# Patient Record
Sex: Male | Born: 1960 | Race: Black or African American | Hispanic: No | Marital: Single | Smoking: Former smoker
Health system: Southern US, Community
[De-identification: ages and names within clinical notes are randomized; demographics above are authoritative.]

## PROBLEM LIST (undated history)

## (undated) DIAGNOSIS — G43909 Migraine, unspecified, not intractable, without status migrainosus: Secondary | ICD-10-CM

## (undated) DIAGNOSIS — S12400A Unspecified displaced fracture of fifth cervical vertebra, initial encounter for closed fracture: Secondary | ICD-10-CM

## (undated) DIAGNOSIS — E6609 Other obesity due to excess calories: Secondary | ICD-10-CM

## (undated) DIAGNOSIS — C61 Malignant neoplasm of prostate: Secondary | ICD-10-CM

## (undated) DIAGNOSIS — S32009A Unspecified fracture of unspecified lumbar vertebra, initial encounter for closed fracture: Secondary | ICD-10-CM

## (undated) DIAGNOSIS — S060X9A Concussion with loss of consciousness of unspecified duration, initial encounter: Secondary | ICD-10-CM

## (undated) DIAGNOSIS — S322XXA Fracture of coccyx, initial encounter for closed fracture: Secondary | ICD-10-CM

## (undated) DIAGNOSIS — M1990 Unspecified osteoarthritis, unspecified site: Secondary | ICD-10-CM

## (undated) DIAGNOSIS — E781 Pure hyperglyceridemia: Secondary | ICD-10-CM

## (undated) DIAGNOSIS — I10 Essential (primary) hypertension: Secondary | ICD-10-CM

## (undated) DIAGNOSIS — S12100A Unspecified displaced fracture of second cervical vertebra, initial encounter for closed fracture: Secondary | ICD-10-CM

## (undated) DIAGNOSIS — Z87891 Personal history of nicotine dependence: Secondary | ICD-10-CM

## (undated) DIAGNOSIS — S3210XA Unspecified fracture of sacrum, initial encounter for closed fracture: Secondary | ICD-10-CM

## (undated) DIAGNOSIS — Z6832 Body mass index (BMI) 32.0-32.9, adult: Secondary | ICD-10-CM

## (undated) HISTORY — DX: Personal history of nicotine dependence: Z87.891

## (undated) HISTORY — DX: Unspecified fracture of unspecified lumbar vertebra, initial encounter for closed fracture: S32.009A

## (undated) HISTORY — DX: Body mass index (bmi) 32.0-32.9, adult: Z68.32

## (undated) HISTORY — DX: Unspecified displaced fracture of fifth cervical vertebra, initial encounter for closed fracture: S12.400A

## (undated) HISTORY — DX: Malignant neoplasm of prostate: C61

## (undated) HISTORY — DX: Unspecified fracture of sacrum, initial encounter for closed fracture: S32.10XA

## (undated) HISTORY — DX: Concussion with loss of consciousness of unspecified duration, initial encounter: S06.0X9A

## (undated) HISTORY — DX: Fracture of coccyx, initial encounter for closed fracture: S32.2XXA

## (undated) HISTORY — PX: OTHER SURGICAL HISTORY: SHX169

## (undated) HISTORY — DX: Other obesity due to excess calories: E66.09

## (undated) HISTORY — DX: Pure hyperglyceridemia: E78.1

## (undated) HISTORY — PX: ACHILLES TENDON REPAIR: SUR1153

## (undated) HISTORY — DX: Unspecified displaced fracture of second cervical vertebra, initial encounter for closed fracture: S12.100A

---

## 2013-12-29 ENCOUNTER — Observation Stay (HOSPITAL_COMMUNITY)
Admission: EM | Admit: 2013-12-29 | Discharge: 2013-12-31 | Disposition: A | Discharge status: Home / Self Care | Payer: No Typology Code available for payment source | Attending: General Surgery | Admitting: General Surgery

## 2013-12-29 ENCOUNTER — Emergency Department (HOSPITAL_COMMUNITY): Payer: No Typology Code available for payment source

## 2013-12-29 ENCOUNTER — Encounter (HOSPITAL_COMMUNITY): Payer: Self-pay | Admitting: Emergency Medicine

## 2013-12-29 DIAGNOSIS — S12400A Unspecified displaced fracture of fifth cervical vertebra, initial encounter for closed fracture: Secondary | ICD-10-CM | POA: Diagnosis not present

## 2013-12-29 DIAGNOSIS — S12100A Unspecified displaced fracture of second cervical vertebra, initial encounter for closed fracture: Secondary | ICD-10-CM | POA: Diagnosis not present

## 2013-12-29 DIAGNOSIS — S3210XA Unspecified fracture of sacrum, initial encounter for closed fracture: Secondary | ICD-10-CM | POA: Diagnosis not present

## 2013-12-29 DIAGNOSIS — T07XXXA Unspecified multiple injuries, initial encounter: Secondary | ICD-10-CM | POA: Diagnosis present

## 2013-12-29 DIAGNOSIS — S060X9A Concussion with loss of consciousness of unspecified duration, initial encounter: Secondary | ICD-10-CM

## 2013-12-29 DIAGNOSIS — S060XAA Concussion with loss of consciousness status unknown, initial encounter: Secondary | ICD-10-CM | POA: Diagnosis present

## 2013-12-29 DIAGNOSIS — S322XXA Fracture of coccyx, initial encounter for closed fracture: Secondary | ICD-10-CM

## 2013-12-29 DIAGNOSIS — F17200 Nicotine dependence, unspecified, uncomplicated: Secondary | ICD-10-CM | POA: Insufficient documentation

## 2013-12-29 DIAGNOSIS — G43909 Migraine, unspecified, not intractable, without status migrainosus: Secondary | ICD-10-CM | POA: Diagnosis not present

## 2013-12-29 DIAGNOSIS — IMO0002 Reserved for concepts with insufficient information to code with codable children: Secondary | ICD-10-CM | POA: Insufficient documentation

## 2013-12-29 DIAGNOSIS — S32009A Unspecified fracture of unspecified lumbar vertebra, initial encounter for closed fracture: Secondary | ICD-10-CM | POA: Diagnosis not present

## 2013-12-29 HISTORY — DX: Unspecified fracture of unspecified lumbar vertebra, initial encounter for closed fracture: S32.009A

## 2013-12-29 HISTORY — DX: Unspecified fracture of sacrum, initial encounter for closed fracture: S32.10XA

## 2013-12-29 HISTORY — DX: Essential (primary) hypertension: I10

## 2013-12-29 HISTORY — DX: Fracture of coccyx, initial encounter for closed fracture: S32.2XXA

## 2013-12-29 HISTORY — DX: Migraine, unspecified, not intractable, without status migrainosus: G43.909

## 2013-12-29 HISTORY — DX: Concussion with loss of consciousness of unspecified duration, initial encounter: S06.0X9A

## 2013-12-29 HISTORY — DX: Concussion with loss of consciousness status unknown, initial encounter: S06.0XAA

## 2013-12-29 HISTORY — DX: Unspecified displaced fracture of second cervical vertebra, initial encounter for closed fracture: S12.100A

## 2013-12-29 HISTORY — DX: Unspecified displaced fracture of fifth cervical vertebra, initial encounter for closed fracture: S12.400A

## 2013-12-29 LAB — I-STAT CHEM 8, ED
BUN: 15 mg/dL (ref 6–23)
CALCIUM ION: 1.02 mmol/L — AB (ref 1.12–1.23)
CHLORIDE: 113 meq/L — AB (ref 96–112)
Creatinine, Ser: 1.4 mg/dL — ABNORMAL HIGH (ref 0.50–1.35)
GLUCOSE: 108 mg/dL — AB (ref 70–99)
HCT: 45 % (ref 39.0–52.0)
Hemoglobin: 15.3 g/dL (ref 13.0–17.0)
POTASSIUM: 3.3 meq/L — AB (ref 3.7–5.3)
Sodium: 139 mEq/L (ref 137–147)
TCO2: 24 mmol/L (ref 0–100)

## 2013-12-29 LAB — URINALYSIS, ROUTINE W REFLEX MICROSCOPIC
BILIRUBIN URINE: NEGATIVE
GLUCOSE, UA: NEGATIVE mg/dL
Ketones, ur: NEGATIVE mg/dL
LEUKOCYTES UA: NEGATIVE
Nitrite: NEGATIVE
PH: 5 (ref 5.0–8.0)
Protein, ur: 30 mg/dL — AB
Specific Gravity, Urine: >1.046 — ABNORMAL HIGH (ref 1.005–1.030)
Urobilinogen, UA: 0.2 mg/dL (ref 0.0–1.0)

## 2013-12-29 LAB — URINE MICROSCOPIC-ADD ON

## 2013-12-29 MED ORDER — MORPHINE SULFATE 4 MG/ML IJ SOLN
6.0000 mg | Freq: Once | INTRAMUSCULAR | Status: DC
  Filled 2013-12-29: qty 2

## 2013-12-29 MED ORDER — MORPHINE SULFATE 2 MG/ML IJ SOLN
2.0000 mg | INTRAMUSCULAR | Status: DC | PRN
  Administered 2013-12-29: 2 mg via INTRAVENOUS
  Filled 2013-12-29 (×2): qty 1

## 2013-12-29 MED ORDER — FENTANYL CITRATE 0.05 MG/ML IJ SOLN
100.0000 ug | Freq: Once | INTRAMUSCULAR | Status: AC
  Administered 2013-12-29: 100 ug via INTRAVENOUS
  Filled 2013-12-29: qty 2

## 2013-12-29 MED ORDER — PANTOPRAZOLE SODIUM 40 MG PO TBEC
40.0000 mg | DELAYED_RELEASE_TABLET | Freq: Every day | ORAL | Status: DC
  Administered 2013-12-29: 40 mg via ORAL
  Filled 2013-12-29: qty 1

## 2013-12-29 MED ORDER — ONDANSETRON HCL 4 MG/2ML IJ SOLN: 4.0000 mg | Freq: Four times a day (QID) | INTRAMUSCULAR | Status: DC | PRN

## 2013-12-29 MED ORDER — SODIUM CHLORIDE 0.9 % IV SOLN
Freq: Once | INTRAVENOUS | Status: AC
  Administered 2013-12-29: 07:00:00 via INTRAVENOUS

## 2013-12-29 MED ORDER — ONDANSETRON HCL 4 MG PO TABS: 4.0000 mg | ORAL_TABLET | Freq: Four times a day (QID) | ORAL | Status: DC | PRN

## 2013-12-29 MED ORDER — TETANUS-DIPHTH-ACELL PERTUSSIS 5-2.5-18.5 LF-MCG/0.5 IM SUSP
0.5000 mL | Freq: Once | INTRAMUSCULAR | Status: AC
  Administered 2013-12-29: 0.5 mL via INTRAMUSCULAR

## 2013-12-29 MED ORDER — HYDROCODONE-ACETAMINOPHEN 10-325 MG PO TABS
0.5000 | ORAL_TABLET | ORAL | Status: DC | PRN
  Administered 2013-12-29 (×2): 2 via ORAL
  Administered 2013-12-30 – 2013-12-31 (×4): 1 via ORAL
  Filled 2013-12-29: qty 1
  Filled 2013-12-29: qty 2
  Filled 2013-12-29 (×3): qty 1
  Filled 2013-12-29: qty 2

## 2013-12-29 MED ORDER — ENOXAPARIN SODIUM 30 MG/0.3ML ~~LOC~~ SOLN
30.0000 mg | Freq: Two times a day (BID) | SUBCUTANEOUS | Status: DC
  Administered 2013-12-29 – 2013-12-31 (×5): 30 mg via SUBCUTANEOUS
  Filled 2013-12-29 (×6): qty 0.3

## 2013-12-29 MED ORDER — IOHEXOL 300 MG/ML  SOLN
100.0000 mL | Freq: Once | INTRAMUSCULAR | Status: AC | PRN
  Administered 2013-12-29: 100 mL via INTRAVENOUS

## 2013-12-29 MED ORDER — TETANUS-DIPHTH-ACELL PERTUSSIS 5-2.5-18.5 LF-MCG/0.5 IM SUSP
INTRAMUSCULAR | Status: AC
  Filled 2013-12-29: qty 0.5

## 2013-12-29 MED ORDER — MORPHINE SULFATE 4 MG/ML IJ SOLN
6.0000 mg | Freq: Once | INTRAMUSCULAR | Status: AC
  Administered 2013-12-29: 6 mg via INTRAVENOUS
  Filled 2013-12-29: qty 2

## 2013-12-29 MED ORDER — PANTOPRAZOLE SODIUM 40 MG IV SOLR
40.0000 mg | Freq: Every day | INTRAVENOUS | Status: DC
  Filled 2013-12-29 (×2): qty 40

## 2013-12-29 MED ORDER — POLYETHYLENE GLYCOL 3350 17 G PO PACK
17.0000 g | PACK | Freq: Every day | ORAL | Status: DC
  Administered 2013-12-30 – 2013-12-31 (×2): 17 g via ORAL
  Filled 2013-12-29 (×3): qty 1

## 2013-12-29 MED ORDER — DOCUSATE SODIUM 100 MG PO CAPS
100.0000 mg | ORAL_CAPSULE | Freq: Two times a day (BID) | ORAL | Status: DC
  Administered 2013-12-29 – 2013-12-31 (×4): 100 mg via ORAL
  Filled 2013-12-29 (×5): qty 1

## 2013-12-29 MED ORDER — POTASSIUM CHLORIDE IN NACL 20-0.45 MEQ/L-% IV SOLN
INTRAVENOUS | Status: DC
  Administered 2013-12-29: 15:00:00 via INTRAVENOUS
  Filled 2013-12-29 (×2): qty 1000

## 2013-12-29 NOTE — Progress Notes (Signed)
SLP Cancellation Note  Patient Details Name: Aaron Frey MRN: [id number] DOB: 1960/05/24   Cancelled treatment:        Patient reports his speech is more slurred than usual, but was unable to participate in Speech/cognitive evaluation due to pain.  RN present, and just gave morphine.  Pt. Continued to c/o head and neck pain and dizziness, and would only lie on his left side/stomach.  "I can't do this today.  I'm in pain."  Deferred eval.   [name] T 12/29/2013, 3:46 PM
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
## 2013-12-29 NOTE — ED Notes (Signed)
Pt is unable to urinate at this time. 

## 2013-12-29 NOTE — Consult Note (Signed)
I have seen and examined Aaron Frey and agree with the note.  His coccyx fracture is minimal and no treatment is needed.  He can weight bear as tolerated.
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
## 2013-12-29 NOTE — ED Notes (Signed)
This RN has cleansed and wrapped the pt's various wounds.

## 2013-12-29 NOTE — ED Notes (Signed)
Pt unable to ambulate with steady gait. Pt sways on his feet. Upon standing, pt became pale and diaphoretic.

## 2013-12-29 NOTE — ED Notes (Signed)
This RN and pt returned from CT 

## 2013-12-29 NOTE — H&P (Signed)
Somnolent but appropriate, possible concussion, agree with above consults.

## 2013-12-29 NOTE — ED Notes (Signed)
Pt unable to walk at this time due to pain. Jacubowitz, MD is aware and orders are to follow.

## 2013-12-29 NOTE — ED Notes (Signed)
This RN accompanying pt to CT. 
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
## 2013-12-30 ENCOUNTER — Encounter (HOSPITAL_COMMUNITY): Payer: Self-pay | Admitting: Orthopedic Surgery

## 2013-12-30 DIAGNOSIS — I10 Essential (primary) hypertension: Secondary | ICD-10-CM | POA: Insufficient documentation

## 2013-12-30 DIAGNOSIS — G43909 Migraine, unspecified, not intractable, without status migrainosus: Secondary | ICD-10-CM | POA: Insufficient documentation

## 2013-12-30 MED ORDER — SUMATRIPTAN SUCCINATE 6 MG/0.5ML ~~LOC~~ SOLN
6.0000 mg | Freq: Once | SUBCUTANEOUS | Status: AC
  Administered 2013-12-30: 6 mg via SUBCUTANEOUS
  Filled 2013-12-30: qty 0.5
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
## 2013-12-30 NOTE — Progress Notes (Signed)
Patient ID: Aaron Frey, male   DOB: November 30, 1960, 53 y.o.   MRN: [id number]   LOS: 1 day   Subjective: C/o HA, pain everywhere when he tries to move. Has hx/o migraines.   Objective: Vital signs in last 24 hours: Temp:  [97.1 F (36.2 C)-99.5 F (37.5 C)] 98.2 F (36.8 C) (09/10 0549) Pulse Rate:  [64-85] 77 (09/10 0549) Resp:  [12-23] 23 (09/09 1130) BP: (118-167)/(62-112) 145/112 mmHg (09/10 0549) SpO2:  [96 %-100 %] 100 % (09/10 0549) Last BM Date: 12/28/13   Physical Exam General appearance: no distress Resp: clear to auscultation bilaterally Cardio: regular rate and rhythm GI: normal findings: bowel sounds normal and soft, non-tender Neuro: Appropriate, PERRL   Assessment/Plan: [facility] Concussion -- Will give Imitrex for likely post-concussive migraine C-spine -- Uncertain if actual injury, to f/u as OP with NS L-spine TVP fx Sacrococcygeal fx -- WBAT FEN -- No issues, SL IV VTE -- SCD's, Lovenox Dispo -- TBI team    Freeman Caldron, PA-C Pager: [phone] General Trauma PA Pager: [phone]  12/30/2013
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
## 2013-12-30 NOTE — Progress Notes (Signed)
Seen, agree with above.  PT evaluation for mobility. Home when adequate mobility and pain control

## 2013-12-30 NOTE — Progress Notes (Signed)
Utilization review completed.  

## 2013-12-31 MED ORDER — HYDROCODONE-ACETAMINOPHEN 5-325 MG PO TABS: 1.0000 | ORAL_TABLET | ORAL | Status: DC | PRN

## 2013-12-31 NOTE — Progress Notes (Signed)
Occupational Therapy Evaluation Addendum:   01-24-14 1500  OT G-codes **NOT FOR INPATIENT CLASS**  Functional Limitation Self care  Self Care Current Status (Q0347) CK  Self Care Goal Status (Q2595) CI  Jeani Hawking, OTR/L [phone]
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
## 2013-12-31 NOTE — Discharge Instructions (Signed)
Wash wounds daily in shower with soap and water. °Do not soak. °Apply antibiotic ointment (e.g. Neosporin) twice daily and as needed to keep moist. °Cover with dry dressing. ° °No driving while taking hydrocodone. °

## 2013-12-31 NOTE — Progress Notes (Signed)
Seen and agree  
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
## 2013-12-31 NOTE — Progress Notes (Signed)
Patient ID: Aaron Frey, male   DOB: 06/19/1960, 53 y.o.   MRN: [id number]   LOS: 2 days   Subjective: No new c/o.   Objective: Vital signs in last 24 hours: Temp:  [98 F (36.7 C)-98.4 F (36.9 C)] 98 F (36.7 C) (09/11 0621) Pulse Rate:  [71-80] 80 (09/11 0621) Resp:  [16] 16 (09/11 0621) BP: (147-154)/(75-85) 154/85 mmHg (09/11 0621) SpO2:  [97 %-99 %] 97 % (09/11 0621) Last BM Date: 12/28/13   Physical Exam General appearance: alert and no distress Resp: clear to auscultation bilaterally Cardio: regular rate and rhythm GI: normal findings: bowel sounds normal and soft, non-tender   Assessment/Plan: M[facility]  Concussion -- Stable C-spine -- Uncertain if actual injury, to f/u as OP with NS  L-spine TVP fx  Sacrococcygeal fx -- WBAT  Dispo -- D/C home    Freeman Caldron, PA-C Pager: [phone] General Trauma PA Pager: [phone]  12/31/2013

## 2013-12-31 NOTE — Progress Notes (Signed)
Utilization review completed.  
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
## 2013-12-31 NOTE — Progress Notes (Addendum)
Rolling walker called into [facility] for delivery.   ADDENDUM** 1621pm  After PT treatment, HHPT was recommended. Pt agreeable to this.  Stated that he does have insurance through his job at [employer] on [address] in [place].  He attempted to call his supervisor since he didn't have his card with him but was unable to reach that person at work.  Pt did confirm that his address and phone number listed in EPIC are correct. Referral called to [facility] rep, [place].  Carlyle Lipa, RN BSN MHA CCM  Case Manager, Trauma Service/Unit 79M [phone]

## 2016-02-27 ENCOUNTER — Emergency Department (INDEPENDENT_AMBULATORY_CARE_PROVIDER_SITE_OTHER): Payer: 59

## 2016-02-27 ENCOUNTER — Encounter: Payer: Self-pay | Admitting: *Deleted

## 2016-02-27 ENCOUNTER — Emergency Department (INDEPENDENT_AMBULATORY_CARE_PROVIDER_SITE_OTHER)
Admission: EM | Admit: 2016-02-27 | Discharge: 2016-02-27 | Disposition: A | Discharge status: Home / Self Care | Payer: 59 | Source: Home / Self Care | Attending: Family Medicine | Admitting: Family Medicine

## 2016-02-27 DIAGNOSIS — M25571 Pain in right ankle and joints of right foot: Secondary | ICD-10-CM | POA: Diagnosis not present

## 2016-02-27 DIAGNOSIS — M79671 Pain in right foot: Secondary | ICD-10-CM | POA: Diagnosis not present

## 2016-02-27 DIAGNOSIS — S86011A Strain of right Achilles tendon, initial encounter: Secondary | ICD-10-CM | POA: Diagnosis not present

## 2016-02-27 MED ORDER — HYDROCODONE-ACETAMINOPHEN 5-325 MG PO TABS: ORAL_TABLET | ORAL | 0 refills | Status: DC

## 2016-02-27 NOTE — ED Triage Notes (Signed)
Pt c/o RT foot and ankle pain x 2 days after injury while playing basketball.

## 2016-02-27 NOTE — Discharge Instructions (Signed)
Wear cam walker.  Elevate foot as much as possible.  Apply ice pack for 20 to 30 minutes, 3 to 4 times daily  Continue until pain decreases.  May take Ibuprofen 200mg , 4 tabs every 8 hours with food.
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
## 2016-02-28 ENCOUNTER — Telehealth: Payer: Self-pay | Admitting: *Deleted

## 2016-02-28 NOTE — Telephone Encounter (Signed)
Pt called given appt information. Dr Pill today at 1:30pm arrive at 1:10pm. Clemens Catholichristy Marquay Kruse, LPN

## 2016-02-29 DIAGNOSIS — Z9889 Other specified postprocedural states: Secondary | ICD-10-CM | POA: Insufficient documentation

## 2016-08-30 ENCOUNTER — Encounter: Payer: Self-pay | Admitting: Physician Assistant

## 2016-08-30 ENCOUNTER — Ambulatory Visit (INDEPENDENT_AMBULATORY_CARE_PROVIDER_SITE_OTHER): Payer: 59 | Admitting: Physician Assistant

## 2016-08-30 ENCOUNTER — Ambulatory Visit (INDEPENDENT_AMBULATORY_CARE_PROVIDER_SITE_OTHER): Payer: 59 | Admitting: Sports Medicine

## 2016-08-30 VITALS — BP 145/76 | HR 102 | Wt 237.0 lb

## 2016-08-30 DIAGNOSIS — Z9889 Other specified postprocedural states: Secondary | ICD-10-CM

## 2016-08-30 DIAGNOSIS — Z7689 Persons encountering health services in other specified circumstances: Secondary | ICD-10-CM | POA: Diagnosis not present

## 2016-08-30 DIAGNOSIS — Z008 Encounter for other general examination: Secondary | ICD-10-CM

## 2016-08-30 DIAGNOSIS — E6609 Other obesity due to excess calories: Secondary | ICD-10-CM | POA: Insufficient documentation

## 2016-08-30 DIAGNOSIS — I10 Essential (primary) hypertension: Secondary | ICD-10-CM

## 2016-08-30 DIAGNOSIS — Z0189 Encounter for other specified special examinations: Secondary | ICD-10-CM | POA: Diagnosis not present

## 2016-08-30 DIAGNOSIS — R2241 Localized swelling, mass and lump, right lower limb: Secondary | ICD-10-CM

## 2016-08-30 DIAGNOSIS — Z6832 Body mass index (BMI) 32.0-32.9, adult: Secondary | ICD-10-CM

## 2016-08-30 DIAGNOSIS — E66811 Other obesity due to excess calories: Secondary | ICD-10-CM

## 2016-08-30 HISTORY — DX: Other obesity due to excess calories: E66.811

## 2016-08-30 HISTORY — DX: Body mass index (BMI) 32.0-32.9, adult: Z68.32

## 2016-08-30 HISTORY — DX: Other obesity due to excess calories: E66.09

## 2016-08-30 NOTE — Progress Notes (Signed)
   Subjective:    I'm seeing this patient as a consultation for:  Gena Frayharley Cummings, PA-C  CC:  Right calf swelling  HPI: This is a pleasant 56 year old male, 6 months ago he had a right Achilles rupture with surgical repair. He never really regained full strength and does complain of some numbness on the bottom of his foot, he also has developed somewhat of a nodule above were we would typically seen Achilles tendinitis and more near the musculotendinous junction of the gastrocnemius. It's only minimally tender. He is occasionally able to play basketball.  Past medical history:  Negative.  See flowsheet/record as well for more information.  Surgical history: Negative.  See flowsheet/record as well for more information.  Family history: Negative.  See flowsheet/record as well for more information.  Social history: Negative.  See flowsheet/record as well for more information.  Allergies, and medications have been entered into the medical record, reviewed, and no changes needed.   Review of Systems: No headache, visual changes, nausea, vomiting, diarrhea, constipation, dizziness, abdominal pain, skin rash, fevers, chills, night sweats, weight loss, swollen lymph nodes, body aches, joint swelling, muscle aches, chest pain, shortness of breath, mood changes, visual or auditory hallucinations.   Objective:   General: Well Developed, well nourished, and in no acute distress.  Neuro/Psych: Alert and oriented x3, extra-ocular muscles intact, able to move all 4 extremities, sensation grossly intact. Skin: Warm and dry, no rashes noted.  Respiratory: Not using accessory muscles, speaking in full sentences, trachea midline.  Cardiovascular: Pulses palpable, no extremity edema. Abdomen: Does not appear distended. Right Ankle: No visible erythema or swelling. Range of motion is full in all directions. Strength is 5/5 in all directions. Stable lateral and medial ligaments; squeeze test and kleiger test  unremarkable; Talar dome nontender; No pain at base of 5th MT; No tenderness over cuboid; No tenderness over N spot or navicular prominence No tenderness on posterior aspects of lateral and medial malleolus No sign of peroneal tendon subluxations; Negative tarsal tunnel tinel's Visible and palpable fullness, feels like a scar tissue nodule at the musculotendinous junction of the gastrocnemius, only mildly positive Thompson's test, he is unable to do a total raise on the affected side.  Impression and Recommendations:   This case required medical decision making of moderate complexity.  Mass of lower leg, right Achilles rupture, repair 6 months ago. Persistent mass over the posterior Achilles musculotendinous junction. Mildly positive Thompson's test. Weakness to plantar flexion. At this point I do think we need an MRI to further evaluate whether he has developed a sural neuroma , as he does have some numbness on the bottom of his foot versus scar tissue versus rerupture. Return to see me after MRI.

## 2016-08-30 NOTE — Assessment & Plan Note (Signed)
Achilles rupture, repair 6 months ago. Persistent mass over the posterior Achilles musculotendinous junction. Mildly positive Thompson's test. Weakness to plantar flexion. At this point I do think we need an MRI to further evaluate whether he has developed a sural neuroma , as he does have some numbness on the bottom of his foot versus scar tissue versus rerupture. Return to see me after MRI.

## 2016-08-30 NOTE — Progress Notes (Signed)
HPI:                                                                Aaron Frey is a 56 y.o. male who presents to [facility]: Primary Care Sports Medicine today to establish care / biometric screening  Current Concerns include swelling of posterior ankle  HTN: patient denies history of HTN. He does not want to take medication. He does not check BP's at home.  Denies vision change, chest pain with exertion, orthopnea, lightheadedness, syncope and edema. Risk factors include: former smoker  Patient reports history of right achilles tendon rupture. He is s/p repair 6 months ago and has been going to PT and playing basketball. He states he has expressed concerned to his surgeon about an area on his posterior ankle that is swollen and firm since his surgery. He states he was told it is scar tissue, but it concerns him.  Health Maintenance Health Maintenance  Topic Date Due  . Hepatitis C Screening  1961/03/04  . HIV Screening  01/03/1978  . COLONOSCOPY  01/03/2013  . INFLUENZA VACCINE  11/20/2016  . TETANUS/TDAP  12/30/2023   Health Habits  Diet: fair  Exercise: PT, basketball 1-2 days per week   Past Medical History:  Diagnosis Date  . Hypertension   . Migraine    Past Surgical History:  Procedure Laterality Date  . ACHILLES TENDON REPAIR     Social History  Substance Use Topics  . Smoking status: Former Smoker    Packs/day: 0.25    Types: Cigarettes    Quit date: 2015  . Smokeless tobacco: Never Used  . Alcohol use Yes     Comment: Occasionally   family history includes ALS in his father; Cancer in his mother; Dementia in his mother; Stroke in his mother.  ROS: negative except as noted in the HPI  Medications: Current Outpatient Prescriptions  Medication Sig Dispense Refill  . aspirin-acetaminophen-caffeine (EXCEDRIN MIGRAINE) 250-250-65 MG per tablet Take 1 tablet by mouth every 6 (six) hours as needed for headache.     No current  facility-administered medications for this visit.    No Known Allergies     Objective:  BP (!) 145/76   Pulse (!) 102   Wt 237 lb (107.5 kg)   BMI 32.14 kg/m  Gen: well-groomed, obese, cooperative, not ill-appearing, no distress HEENT: normal conjunctiva, oropharynx clear, moist mucus membranes, neck supple, trachea midline, no thyromegaly or tenderness Pulm: Normal work of breathing, normal phonation, clear to auscultation bilaterally CV: Normal rate, regular rhythm, s1 and s2 distinct, no murmurs, clicks or rubs, no carotid bruit Neuro: alert and oriented x 3, EOM's intact, normal tone, no tremor MSK: right posterior ankle with palpable nodule and well-healed surgical scar, moving all extremities, normal gait and station, no peripheral edema Skin: warm and dry, no rashes or lesions on exposed skin Psych: appearance casual, normal affect, euthymic mood, normal speech and thought content   No results found for this or any previous visit (from the past 72 hour(s)). No results found.    Assessment and Plan: 56 y.o. male with    1. Encounter to establish care - Cologuard ordered - recommended annual CPE w/prostate exam   2. Elevated blood pressure reading - likely  HTN, but patient will log pressures at home and follow-up in 4 weeks - discussed that lifestyle measures are first line treatment - discussed risks of untreated hypertension  3. Class 1 obesity due to excess calories without serious comorbidity with body mass index (BMI) of 32.0 to 32.9 in adult - discussed healthy lifestyle to include DASH eating plan, regular cardiovascular exercise - discussed weight loss goal of 20-30 pounds  4. Encounter for biometric screening - CBC - Comprehensive metabolic panel - Lipid Panel w/reflex Direct LDL  5. s/p achilles tendon repair - consulted Dr. Benjamin Stainhekkekandam at patient's request for specialty consult  Patient education and anticipatory guidance given Patient agrees  with treatment plan Follow-up in 4 weeks or sooner as needed  Levonne Hubertharley E. Dora Simeone PA-C

## 2016-08-30 NOTE — Patient Instructions (Addendum)
I have also ordered fasting labs. The lab is a walk-in open M-F 8a-5p (closed 12:30-1:30p). Nothing to eat or drink after midnight or at least 9 hours before your blood draw. You can have water and your medications.   I have ordered Cologuard for your colon cancer screening. Contact your insurance company to find out your out-of-pocket cost (varies from $0-$650).   For your blood pressure: - Check blood pressure at home  - Check around the same time each day in a relaxed setting (rest for at least 3 minutes, legs uncrossed, arm above level of your heart) - Limit salt. Follow DASH eating plan - Follow-up in 4 weeks  Physical Activity Recommendations for modifying lipids and lowering blood pressure Engage in aerobic physical activity to reduce LDL-cholesterol, non-HDL-cholesterol, and blood pressure  Frequency: 3-4 sessions per week  Intensity: moderate to vigorous  Duration: 40 minutes on average  Physical Activity Recommendations for secondary prevention 1. Aerobic exercise  Frequency: 3-5 sessions per week  Intensity: 50-80% capacity  Duration: 20 - 60 minutes  Examples: walking, treadmill, cycling, rowing, stair climbing, and arm/leg ergometry  2. Resistance exercise  Frequency: 2-3 sessions per week  Intensity: 10-15 repetitions/set to moderate fatigue  Duration: 1-3 sets of 8-10 upper and lower body exercises  Examples: calisthenics, elastic bands, cuff/hand weights, dumbbels, free weights, wall pulleys, and weight machines  Heart-Healthy Lifestyle  Eating a diet rich in vegetables, fruits and whole grains: also includes low-fat dairy products, poultry, fish, legumes, and nuts; limit intake of sweets, sugar-sweetened beverages and red meats  Getting regular exercise  Maintaining a healthy weight  Not smoking or getting help quitting  Staying on top of your health; for some people, lifestyle changes alone may not be enough to prevent a heart attack or stroke. In  these cases, taking a statin at the right dose will most likely be necessary   DASH Eating Plan DASH stands for "Dietary Approaches to Stop Hypertension." The DASH eating plan is a healthy eating plan that has been shown to reduce high blood pressure (hypertension). It may also reduce your risk for type 2 diabetes, heart disease, and stroke. The DASH eating plan may also help with weight loss. What are tips for following this plan? General guidelines   Avoid eating more than 2,300 mg (milligrams) of salt (sodium) a day. If you have hypertension, you may need to reduce your sodium intake to 1,500 mg a day.  Limit alcohol intake to no more than 1 drink a day for nonpregnant women and 2 drinks a day for men. One drink equals 12 oz of beer, 5 oz of wine, or 1 oz of hard liquor.  Work with your health care provider to maintain a healthy body weight or to lose weight. Ask what an ideal weight is for you.  Get at least 30 minutes of exercise that causes your heart to beat faster (aerobic exercise) most days of the week. Activities may include walking, swimming, or biking.  Work with your health care provider or diet and nutrition specialist (dietitian) to adjust your eating plan to your individual calorie needs. Reading food labels   Check food labels for the amount of sodium per serving. Choose foods with less than 5 percent of the Daily Value of sodium. Generally, foods with less than 300 mg of sodium per serving fit into this eating plan.  To find whole grains, look for the word "whole" as the first word in the ingredient list. Shopping  Buy products labeled as "low-sodium" or "no salt added."  Buy fresh foods. Avoid canned foods and premade or frozen meals. Cooking   Avoid adding salt when cooking. Use salt-free seasonings or herbs instead of table salt or sea salt. Check with your health care provider or pharmacist before using salt substitutes.  Do not fry foods. Cook foods using  healthy methods such as baking, boiling, grilling, and broiling instead.  Cook with heart-healthy oils, such as olive, canola, soybean, or sunflower oil. Meal planning    Eat a balanced diet that includes:  5 or more servings of fruits and vegetables each day. At each meal, try to fill half of your plate with fruits and vegetables.  Up to 6-8 servings of whole grains each day.  Less than 6 oz of lean meat, poultry, or fish each day. A 3-oz serving of meat is about the same size as a deck of cards. One egg equals 1 oz.  2 servings of low-fat dairy each day.  A serving of nuts, seeds, or beans 5 times each week.  Heart-healthy fats. Healthy fats called Omega-3 fatty acids are found in foods such as flaxseeds and coldwater fish, like sardines, salmon, and mackerel.  Limit how much you eat of the following:  Canned or prepackaged foods.  Food that is high in trans fat, such as fried foods.  Food that is high in saturated fat, such as fatty meat.  Sweets, desserts, sugary drinks, and other foods with added sugar.  Full-fat dairy products.  Do not salt foods before eating.  Try to eat at least 2 vegetarian meals each week.  Eat more home-cooked food and less restaurant, buffet, and fast food.  When eating at a restaurant, ask that your food be prepared with less salt or no salt, if possible. What foods are recommended? The items listed may not be a complete list. Talk with your dietitian about what dietary choices are best for you. Grains  Whole-grain or whole-wheat bread. Whole-grain or whole-wheat pasta. Brown rice. Orpah Cobb. Bulgur. Whole-grain and low-sodium cereals. Pita bread. Low-fat, low-sodium crackers. Whole-wheat flour tortillas. Vegetables  Fresh or frozen vegetables (raw, steamed, roasted, or grilled). Low-sodium or reduced-sodium tomato and vegetable juice. Low-sodium or reduced-sodium tomato sauce and tomato paste. Low-sodium or reduced-sodium canned  vegetables. Fruits  All fresh, dried, or frozen fruit. Canned fruit in natural juice (without added sugar). Meat and other protein foods  Skinless chicken or [place]. Ground chicken or [place]. Pork with fat trimmed off. Fish and seafood. Egg whites. Dried beans, peas, or lentils. Unsalted nuts, nut butters, and seeds. Unsalted canned beans. Lean cuts of beef with fat trimmed off. Low-sodium, lean deli meat. Dairy  Low-fat (1%) or fat-free (skim) milk. Fat-free, low-fat, or reduced-fat cheeses. Nonfat, low-sodium ricotta or cottage cheese. Low-fat or nonfat yogurt. Low-fat, low-sodium cheese. Fats and oils  Soft margarine without trans fats. Vegetable oil. Low-fat, reduced-fat, or light mayonnaise and salad dressings (reduced-sodium). Canola, safflower, olive, soybean, and sunflower oils. Avocado. Seasoning and other foods  Herbs. Spices. Seasoning mixes without salt. Unsalted popcorn and pretzels. Fat-free sweets. What foods are not recommended? The items listed may not be a complete list. Talk with your dietitian about what dietary choices are best for you. Grains  Baked goods made with fat, such as croissants, muffins, or some breads. Dry pasta or rice meal packs. Vegetables  Creamed or fried vegetables. Vegetables in a cheese sauce. Regular canned vegetables (not low-sodium or reduced-sodium). Regular canned tomato sauce  and paste (not low-sodium or reduced-sodium). Regular tomato and vegetable juice (not low-sodium or reduced-sodium). Rosita FirePickles. Olives. Fruits  Canned fruit in a light or heavy syrup. Fried fruit. Fruit in cream or butter sauce. Meat and other protein foods  Fatty cuts of meat. Ribs. Fried meat. Tomasa BlaseBacon. Sausage. Bologna and other processed lunch meats. Salami. Fatback. Hotdogs. Bratwurst. Salted nuts and seeds. Canned beans with added salt. Canned or smoked fish. Whole eggs or egg yolks. Chicken or [place] with skin. Dairy  Whole or 2% milk, cream, and half-and-half. Whole or  full-fat cream cheese. Whole-fat or sweetened yogurt. Full-fat cheese. Nondairy creamers. Whipped toppings. Processed cheese and cheese spreads. Fats and oils  Butter. Stick margarine. Lard. Shortening. Ghee. Bacon fat. Tropical oils, such as coconut, palm kernel, or palm oil. Seasoning and other foods  Salted popcorn and pretzels. Onion salt, garlic salt, seasoned salt, table salt, and sea salt. Worcestershire sauce. Tartar sauce. Barbecue sauce. Teriyaki sauce. Soy sauce, including reduced-sodium. Steak sauce. Canned and packaged gravies. Fish sauce. Oyster sauce. Cocktail sauce. Horseradish that you find on the shelf. Ketchup. Mustard. Meat flavorings and tenderizers. Bouillon cubes. Hot sauce and Tabasco sauce. Premade or packaged marinades. Premade or packaged taco seasonings. Relishes. Regular salad dressings. Where to find more information:  National Heart, Lung, and Blood Institute: PopSteam.iswww.nhlbi.nih.gov  American Heart Association: www.heart.org Summary  The DASH eating plan is a healthy eating plan that has been shown to reduce high blood pressure (hypertension). It may also reduce your risk for type 2 diabetes, heart disease, and stroke.  With the DASH eating plan, you should limit salt (sodium) intake to 2,300 mg a day. If you have hypertension, you may need to reduce your sodium intake to 1,500 mg a day.  When on the DASH eating plan, aim to eat more fresh fruits and vegetables, whole grains, lean proteins, low-fat dairy, and heart-healthy fats.  Work with your health care provider or diet and nutrition specialist (dietitian) to adjust your eating plan to your individual calorie needs. This information is not intended to replace advice given to you by your health care provider. Make sure you discuss any questions you have with your health care provider. Document Released: 03/28/2011 Document Revised: 04/01/2016 Document Reviewed: 04/01/2016 Elsevier Interactive Patient Education  2017  [employer]

## 2016-09-02 ENCOUNTER — Ambulatory Visit (INDEPENDENT_AMBULATORY_CARE_PROVIDER_SITE_OTHER): Payer: 59

## 2016-09-02 DIAGNOSIS — M7661 Achilles tendinitis, right leg: Secondary | ICD-10-CM

## 2016-09-02 DIAGNOSIS — R2241 Localized swelling, mass and lump, right lower limb: Secondary | ICD-10-CM

## 2016-09-02 LAB — CBC
HEMATOCRIT: 42.1 % (ref 38.5–50.0)
HEMOGLOBIN: 14 g/dL (ref 13.2–17.1)
MCH: 28.5 pg (ref 27.0–33.0)
MCHC: 33.3 g/dL (ref 32.0–36.0)
MCV: 85.7 fL (ref 80.0–100.0)
MPV: 8.6 fL (ref 7.5–12.5)
Platelets: 296 10*3/uL (ref 140–400)
RBC: 4.91 MIL/uL (ref 4.20–5.80)
RDW: 14.6 % (ref 11.0–15.0)
WBC: 7.4 10*3/uL (ref 3.8–10.8)

## 2016-09-03 ENCOUNTER — Encounter: Payer: Self-pay | Admitting: Sports Medicine

## 2016-09-03 ENCOUNTER — Ambulatory Visit (INDEPENDENT_AMBULATORY_CARE_PROVIDER_SITE_OTHER): Payer: 59 | Admitting: Sports Medicine

## 2016-09-03 ENCOUNTER — Encounter: Payer: Self-pay | Admitting: Physician Assistant

## 2016-09-03 DIAGNOSIS — Z9889 Other specified postprocedural states: Secondary | ICD-10-CM | POA: Diagnosis not present

## 2016-09-03 DIAGNOSIS — R740 Nonspecific elevation of levels of transaminase and lactic acid dehydrogenase [LDH]: Secondary | ICD-10-CM

## 2016-09-03 DIAGNOSIS — E781 Pure hyperglyceridemia: Secondary | ICD-10-CM | POA: Insufficient documentation

## 2016-09-03 DIAGNOSIS — R7401 Elevation of levels of liver transaminase levels: Secondary | ICD-10-CM | POA: Insufficient documentation

## 2016-09-03 DIAGNOSIS — M722 Plantar fascial fibromatosis: Secondary | ICD-10-CM | POA: Diagnosis not present

## 2016-09-03 HISTORY — DX: Pure hyperglyceridemia: E78.1

## 2016-09-03 LAB — LIPID PANEL W/REFLEX DIRECT LDL
Cholesterol: 190 mg/dL (ref <200)
HDL: 33 mg/dL — AB (ref >40)
LDL-CHOLESTEROL: 125 mg/dL — AB
NON-HDL CHOLESTEROL (CALC): 157 mg/dL — AB (ref <130)
TRIGLYCERIDES: 203 mg/dL — AB (ref <150)
Total CHOL/HDL Ratio: 5.8 Ratio — ABNORMAL HIGH (ref <5.0)

## 2016-09-03 LAB — COMPREHENSIVE METABOLIC PANEL
ALBUMIN: 4.6 g/dL (ref 3.6–5.1)
ALT: 35 U/L (ref 9–46)
AST: 37 U/L — ABNORMAL HIGH (ref 10–35)
Alkaline Phosphatase: 59 U/L (ref 40–115)
BUN: 18 mg/dL (ref 7–25)
CALCIUM: 9.7 mg/dL (ref 8.6–10.3)
CHLORIDE: 103 mmol/L (ref 98–110)
CO2: 27 mmol/L (ref 20–31)
CREATININE: 1.39 mg/dL — AB (ref 0.70–1.33)
Glucose, Bld: 105 mg/dL — ABNORMAL HIGH (ref 65–99)
POTASSIUM: 4.6 mmol/L (ref 3.5–5.3)
Sodium: 138 mmol/L (ref 135–146)
TOTAL PROTEIN: 7.9 g/dL (ref 6.1–8.1)
Total Bilirubin: 0.4 mg/dL (ref 0.2–1.2)

## 2016-09-03 MED ORDER — NITROGLYCERIN 0.2 MG/HR TD PT24: MEDICATED_PATCH | TRANSDERMAL | 11 refills | Status: DC

## 2016-09-03 NOTE — Assessment & Plan Note (Signed)
Rehabilitation exercises given, return for custom molded orthotics.

## 2016-09-03 NOTE — Assessment & Plan Note (Addendum)
Overall doing well, MRI shows evidence of tendinosis in the proximal Achilles, I do think this is simply some edema around the repair. Heel lift will be placed in custom orthotic, formal physical therapy for eccentric exercises, topical nitroglycerin patches, may return to work. Return to see me in one month, I do suspect we will extend his physical therapy if he's not better at the four-week mark.

## 2016-09-03 NOTE — Progress Notes (Signed)
  Subjective:    CC: Follow-up  HPI: This is a pleasant 56 year old male, he had an Achilles rupture, repaired 6 months ago, developed a persistent mass over the posterior proximal Achilles at the musculotendinous junction. Not really tender, able to play basketball. He is returning to work Advertising account executivetomorrow. We obtained an MRI the results of which will be dictated below. In addition he has some pain at the plantar aspect of his right heel, worse with the first few steps in the morning. Moderate, persistent without radiation.   Past medical history:  Negative.  See flowsheet/record as well for more information.  Surgical history: Negative.  See flowsheet/record as well for more information.  Family history: Negative.  See flowsheet/record as well for more information.  Social history: Negative.  See flowsheet/record as well for more information.  Allergies, and medications have been entered into the medical record, reviewed, and no changes needed.   Review of Systems: No fevers, chills, night sweats, weight loss, chest pain, or shortness of breath.   Objective:    General: Well Developed, well nourished, and in no acute distress.  Neuro: Alert and oriented x3, extra-ocular muscles intact, sensation grossly intact.  HEENT: Normocephalic, atraumatic, pupils equal round reactive to light, neck supple, no masses, no lymphadenopathy, thyroid nonpalpable.  Skin: Warm and dry, no rashes. Cardiac: Regular rate and rhythm, no murmurs rubs or gallops, no lower extremity edema.  Respiratory: Clear to auscultation bilaterally. Not using accessory muscles, speaking in full sentences. Right Foot: No visible erythema or swelling. Range of motion is full in all directions. Strength is 5/5 in all directions. No hallux valgus. No pes cavus or pes planus. No abnormal callus noted. No pain over the navicular prominence, or base of fifth metatarsal. Tenderness to palpation of the calcaneal insertion of plantar  fascia. No pain at the Achilles insertion. No pain over the calcaneal bursa. No pain of the retrocalcaneal bursa. No tenderness to palpation over the tarsals, metatarsals, or phalanges. No hallux rigidus or limitus. No tenderness palpation over interphalangeal joints. No pain with compression of the metatarsal heads. Neurovascularly intact distally.  MRI reviewed and shows some increased thickening at the proximal Achilles at the musculotendinous junction with a mild amount of T2 signal, this appears to be more scar at the repair, no evidence of failure of the repair.  Impression and Recommendations:    S/P Achilles tendon repair Overall doing well, MRI shows evidence of tendinosis in the proximal Achilles, I do think this is simply some edema around the repair. Heel lift will be placed in custom orthotic, formal physical therapy for eccentric exercises, topical nitroglycerin patches, may return to work. Return to see me in one month, I do suspect we will extend his physical therapy if he's not better at the four-week mark.  Plantar fasciitis, right Rehabilitation exercises given, return for custom molded orthotics.

## 2016-09-25 ENCOUNTER — Telehealth: Payer: Self-pay

## 2016-09-25 LAB — COLOGUARD: COLOGUARD: NEGATIVE

## 2016-09-25 NOTE — Telephone Encounter (Signed)
-----   [facility] from [facility] MemphisCharley Elizabeth Cummings, [place] sent at 09/25/2016 11:45 AM EDT ----- Regarding: Cologuard Good news, Cologuard was negative. Recommend repeat Cologuard in 3 years for colon cancer screening

## 2016-09-25 NOTE — Telephone Encounter (Signed)
Patient advised of results and recommendations.  

## 2016-09-25 NOTE — Telephone Encounter (Signed)
Left vm for pt to return call to clinic -EH/RMA  

## 2016-09-27 ENCOUNTER — Encounter: Payer: Self-pay | Admitting: Physician Assistant

## 2016-10-07 ENCOUNTER — Ambulatory Visit (INDEPENDENT_AMBULATORY_CARE_PROVIDER_SITE_OTHER): Payer: 59 | Admitting: Physician Assistant

## 2016-10-07 VITALS — BP 132/83 | HR 74 | Wt 225.0 lb

## 2016-10-07 DIAGNOSIS — N529 Male erectile dysfunction, unspecified: Secondary | ICD-10-CM | POA: Insufficient documentation

## 2016-10-07 DIAGNOSIS — M5030 Other cervical disc degeneration, unspecified cervical region: Secondary | ICD-10-CM

## 2016-10-07 DIAGNOSIS — Z23 Encounter for immunization: Secondary | ICD-10-CM | POA: Diagnosis not present

## 2016-10-07 DIAGNOSIS — R740 Nonspecific elevation of levels of transaminase and lactic acid dehydrogenase [LDH]: Secondary | ICD-10-CM

## 2016-10-07 DIAGNOSIS — Z1159 Encounter for screening for other viral diseases: Secondary | ICD-10-CM | POA: Diagnosis not present

## 2016-10-07 DIAGNOSIS — Z820 Family history of epilepsy and other diseases of the nervous system: Secondary | ICD-10-CM | POA: Insufficient documentation

## 2016-10-07 DIAGNOSIS — G43709 Chronic migraine without aura, not intractable, without status migrainosus: Secondary | ICD-10-CM

## 2016-10-07 DIAGNOSIS — Z0000 Encounter for general adult medical examination without abnormal findings: Secondary | ICD-10-CM

## 2016-10-07 DIAGNOSIS — R7401 Elevation of levels of liver transaminase levels: Secondary | ICD-10-CM

## 2016-10-07 MED ORDER — DEXAMETHASONE SODIUM PHOSPHATE 4 MG/ML IJ SOLN
4.0000 mg | Freq: Once | INTRAMUSCULAR | Status: AC
  Administered 2016-10-07: 4 mg via INTRAMUSCULAR

## 2016-10-07 MED ORDER — KETOROLAC TROMETHAMINE 30 MG/ML IJ SOLN
30.0000 mg | Freq: Once | INTRAMUSCULAR | Status: AC
  Administered 2016-10-07: 30 mg via INTRAMUSCULAR

## 2016-10-07 MED ORDER — TOPIRAMATE ER 50 MG PO CAP24: 50.0000 mg | ORAL_CAPSULE | Freq: Every day | ORAL | 5 refills | Status: DC

## 2016-10-07 NOTE — Patient Instructions (Addendum)
- Plan to go downstairs for labs. The lab is a walk-in open M-F 7:30a-4:30p (closed 12:30-1:30p).  - You will get a phone call to schedule your MRI of your brain  - Start Trokendi 1 capsule at bedtime for migraine prophylaxis. Follow-up in 4 weeks. Keep a headache diary - You will get a phone call to schedule an appointment with genetic counseling - Make an appointment to see Dr. Darene Lamer for orthotics - Return in 2 months for your next Shingles vaccine   Preventive Care 40-64 Years, Male Preventive care refers to lifestyle choices and visits with your health care provider that can promote health and wellness. What does preventive care include?  A yearly physical exam. This is also called an annual well check.  Dental exams once or twice a year.  Routine eye exams. Ask your health care provider how often you should have your eyes checked.  Personal lifestyle choices, including: ? Daily care of your teeth and gums. ? Regular physical activity. ? Eating a healthy diet. ? Avoiding tobacco and drug use. ? Limiting alcohol use. ? Practicing safe sex. ? Taking low-dose aspirin every day starting at age 8. What happens during an annual well check? The services and screenings done by your health care provider during your annual well check will depend on your age, overall health, lifestyle risk factors, and family history of disease. Counseling Your health care provider may ask you questions about your:  Alcohol use.  Tobacco use.  Drug use.  Emotional well-being.  Home and relationship well-being.  Sexual activity.  Eating habits.  Work and work Statistician.  Screening You may have the following tests or measurements:  Height, weight, and BMI.  Blood pressure.  Lipid and cholesterol levels. These may be checked every 5 years, or more frequently if you are over 37 years old.  Skin check.  Lung cancer screening. You may have this screening every year starting at age 30 if  you have a 30-pack-year history of smoking and currently smoke or have quit within the past 15 years.  Fecal occult blood test (FOBT) of the stool. You may have this test every year starting at age 53.  Flexible sigmoidoscopy or colonoscopy. You may have a sigmoidoscopy every 5 years or a colonoscopy every 10 years starting at age 9.  Prostate cancer screening. Recommendations will vary depending on your family history and other risks.  Hepatitis C blood test.  Hepatitis B blood test.  Sexually transmitted disease (STD) testing.  Diabetes screening. This is done by checking your blood sugar (glucose) after you have not eaten for a while (fasting). You may have this done every 1-3 years.  Discuss your test results, treatment options, and if necessary, the need for more tests with your health care provider. Vaccines Your health care provider may recommend certain vaccines, such as:  Influenza vaccine. This is recommended every year.  Tetanus, diphtheria, and acellular pertussis (Tdap, Td) vaccine. You may need a Td booster every 10 years.  Varicella vaccine. You may need this if you have not been vaccinated.  Zoster vaccine. You may need this after age 80.  Measles, mumps, and rubella (MMR) vaccine. You may need at least one dose of MMR if you were born in 1957 or later. You may also need a second dose.  Pneumococcal 13-valent conjugate (PCV13) vaccine. You may need this if you have certain conditions and have not been vaccinated.  Pneumococcal polysaccharide (PPSV23) vaccine. You may need one or two doses  if you smoke cigarettes or if you have certain conditions.  Meningococcal vaccine. You may need this if you have certain conditions.  Hepatitis A vaccine. You may need this if you have certain conditions or if you travel or work in places where you may be exposed to hepatitis A.  Hepatitis B vaccine. You may need this if you have certain conditions or if you travel or work in  places where you may be exposed to hepatitis B.  Haemophilus influenzae type b (Hib) vaccine. You may need this if you have certain risk factors.  Talk to your health care provider about which screenings and vaccines you need and how often you need them. This information is not intended to replace advice given to you by your health care provider. Make sure you discuss any questions you have with your health care provider. Document Released: 05/05/2015 Document Revised: 12/27/2015 Document Reviewed: 02/07/2015 Elsevier Interactive Patient Education  2017 [employer].
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
## 2016-10-08 ENCOUNTER — Encounter: Payer: Self-pay | Admitting: Physician Assistant

## 2016-10-08 DIAGNOSIS — M5030 Other cervical disc degeneration, unspecified cervical region: Secondary | ICD-10-CM | POA: Insufficient documentation

## 2016-10-10 ENCOUNTER — Encounter: Payer: Self-pay | Admitting: Sports Medicine

## 2016-10-10 ENCOUNTER — Ambulatory Visit (INDEPENDENT_AMBULATORY_CARE_PROVIDER_SITE_OTHER): Payer: 59 | Admitting: Sports Medicine

## 2016-10-10 DIAGNOSIS — Z9889 Other specified postprocedural states: Secondary | ICD-10-CM | POA: Diagnosis not present

## 2016-10-10 DIAGNOSIS — N529 Male erectile dysfunction, unspecified: Secondary | ICD-10-CM | POA: Diagnosis not present

## 2016-10-10 LAB — COMPLETE METABOLIC PANEL WITH GFR
ALBUMIN: 4.2 g/dL (ref 3.6–5.1)
ALK PHOS: 75 U/L (ref 40–115)
ALT: 30 U/L (ref 9–46)
AST: 24 U/L (ref 10–35)
BILIRUBIN TOTAL: 0.3 mg/dL (ref 0.2–1.2)
BUN: 17 mg/dL (ref 7–25)
CO2: 26 mmol/L (ref 20–31)
CREATININE: 1.32 mg/dL (ref 0.70–1.33)
Calcium: 9.6 mg/dL (ref 8.6–10.3)
Chloride: 103 mmol/L (ref 98–110)
GFR, EST NON AFRICAN AMERICAN: 61 mL/min (ref >=60)
GFR, Est African American: 71 mL/min (ref >=60)
GLUCOSE: 90 mg/dL (ref 65–99)
Potassium: 4.4 mmol/L (ref 3.5–5.3)
SODIUM: 139 mmol/L (ref 135–146)
TOTAL PROTEIN: 7.4 g/dL (ref 6.1–8.1)

## 2016-10-10 MED ORDER — SILDENAFIL CITRATE 100 MG PO TABS: 100.0000 mg | ORAL_TABLET | ORAL | 11 refills | Status: DC | PRN

## 2016-10-10 NOTE — Assessment & Plan Note (Signed)
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 Custom orthotics with heel lifts, topical nitroglycerin patches will be continued except on days when sildenafil will be taken. Pain right now is mostly at the Achilles insertion. Return in one month.

## 2016-10-10 NOTE — Progress Notes (Signed)
    Patient was fitted for a : standard, cushioned, semi-rigid orthotic. The orthotic was heated and afterward the patient stood on the orthotic blank positioned on the orthotic stand. The patient was positioned in subtalar neutral position and 10 degrees of ankle dorsiflexion in a weight bearing stance. After completion of molding, a stable base was applied to the orthotic blank. The blank was ground to a stable position for weight bearing. Size: 12 Base: [facility] Additional Posting and Padding: Bilateral heel wedges The patient ambulated these, and they were very comfortable.  I spent 40 minutes with this patient, greater than 50% was face-to-face time counseling regarding the below diagnosis.

## 2016-10-10 NOTE — Assessment & Plan Note (Signed)
Starting sildenafil, needs to discontinue nitroglycerin patches 24 hours before using this medication.

## 2016-10-11 LAB — HEPATITIS C ANTIBODY: HCV Ab: NEGATIVE

## 2016-10-11 NOTE — Progress Notes (Signed)
Labs look good Liver enzymes are in a normal range

## 2016-10-14 ENCOUNTER — Telehealth: Payer: Self-pay | Admitting: Physician Assistant

## 2016-10-14 ENCOUNTER — Ambulatory Visit (INDEPENDENT_AMBULATORY_CARE_PROVIDER_SITE_OTHER): Payer: 59

## 2016-10-14 DIAGNOSIS — R51 Headache: Secondary | ICD-10-CM

## 2016-10-14 DIAGNOSIS — R479 Unspecified speech disturbances: Secondary | ICD-10-CM

## 2016-10-14 DIAGNOSIS — G43009 Migraine without aura, not intractable, without status migrainosus: Secondary | ICD-10-CM

## 2016-10-14 MED ORDER — GADOBENATE DIMEGLUMINE 529 MG/ML IV SOLN
20.0000 mL | Freq: Once | INTRAVENOUS | Status: AC | PRN
  Administered 2016-10-14: 20 mL via INTRAVENOUS

## 2016-10-14 NOTE — Telephone Encounter (Signed)
Received Pa on Trokendi XR sent through cover my meds waiting on authorization. - CF

## 2016-10-15 NOTE — Telephone Encounter (Signed)
To PCP, not me hehe.  Also do you mean immediate release topiramate.

## 2016-10-15 NOTE — Telephone Encounter (Signed)
Received fax from [employer] and they denied coverage on Trokendi XR because patient needs to try topiramate  extended release - CF

## 2016-10-16 MED ORDER — TOPIRAMATE 50 MG PO TABS: ORAL_TABLET | ORAL | 2 refills | Status: DC

## 2016-10-16 NOTE — Telephone Encounter (Signed)
Charlie    I sent this to Dr. Karie Schwalbe instead of you by mistake cause his name was on the prior authorization. Please advise.

## 2016-10-16 NOTE — Telephone Encounter (Signed)
Prescription for immediate release Topamax sent to pharmacy

## 2016-10-16 NOTE — Progress Notes (Signed)
There is no evidence of a mass or bleeding There are changes associated with chronic high blood pressure Plan to treat migraines with topamax and follow-up in 1 month Remind him to stop the daily Excedrin to prevent rebound medication overuse headache

## 2016-10-18 NOTE — Telephone Encounter (Signed)
Patient advised that the topamax was sent to the pharmacy. He did already pick up the medication and will start taking the medication tonight.

## 2016-11-07 ENCOUNTER — Ambulatory Visit (INDEPENDENT_AMBULATORY_CARE_PROVIDER_SITE_OTHER): Payer: 59 | Admitting: Sports Medicine

## 2016-11-07 ENCOUNTER — Encounter: Payer: Self-pay | Admitting: Physician Assistant

## 2016-11-07 ENCOUNTER — Encounter: Payer: Self-pay | Admitting: Sports Medicine

## 2016-11-07 ENCOUNTER — Ambulatory Visit (INDEPENDENT_AMBULATORY_CARE_PROVIDER_SITE_OTHER): Payer: 59 | Admitting: Physician Assistant

## 2016-11-07 VITALS — BP 147/73 | HR 81 | Wt 220.0 lb

## 2016-11-07 DIAGNOSIS — Z87828 Personal history of other (healed) physical injury and trauma: Secondary | ICD-10-CM

## 2016-11-07 DIAGNOSIS — G43709 Chronic migraine without aura, not intractable, without status migrainosus: Secondary | ICD-10-CM

## 2016-11-07 DIAGNOSIS — N529 Male erectile dysfunction, unspecified: Secondary | ICD-10-CM | POA: Diagnosis not present

## 2016-11-07 DIAGNOSIS — Z9889 Other specified postprocedural states: Secondary | ICD-10-CM

## 2016-11-07 DIAGNOSIS — L905 Scar conditions and fibrosis of skin: Secondary | ICD-10-CM

## 2016-11-07 DIAGNOSIS — I10 Essential (primary) hypertension: Secondary | ICD-10-CM

## 2016-11-07 MED ORDER — TRIAMCINOLONE ACETONIDE 0.5 % EX CREA: 1.0000 "application " | TOPICAL_CREAM | Freq: Two times a day (BID) | CUTANEOUS | 0 refills | Status: DC

## 2016-11-07 MED ORDER — PROMETHAZINE HCL 12.5 MG PO TABS: 12.5000 mg | ORAL_TABLET | Freq: Three times a day (TID) | ORAL | 0 refills | Status: DC | PRN

## 2016-11-07 NOTE — Assessment & Plan Note (Signed)
Patient experimented with a nitroglycerin patch at the base of his penis, seems to have worked very well and made it bigger.
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
## 2016-11-07 NOTE — Progress Notes (Signed)
  Subjective:    CC:  Follow-up  HPI: Achilles tendinosis: Doing much better, essentially pain-free now, he did a bit of physical therapy, topical nitroglycerin, happy with how things are going. He is agreeable to do some more long-term home rehabilitation exercises, eccentric.  Erectile dysfunction: Really hasn't used Viagra but does notice that a nitroglycerin patch placed on the base of his penis improves erectile function and size.  Past medical history:  Negative.  See flowsheet/record as well for more information.  Surgical history: Negative.  See flowsheet/record as well for more information.  Family history: Negative.  See flowsheet/record as well for more information.  Social history: Negative.  See flowsheet/record as well for more information.  Allergies, and medications have been entered into the medical record, reviewed, and no changes needed.   Review of Systems: No fevers, chills, night sweats, weight loss, chest pain, or shortness of breath.   Objective:    General: Well Developed, well nourished, and in no acute distress.  Neuro: Alert and oriented x3, extra-ocular muscles intact, sensation grossly intact.  HEENT: Normocephalic, atraumatic, pupils equal round reactive to light, neck supple, no masses, no lymphadenopathy, thyroid nonpalpable.  Skin: Warm and dry, no rashes. Cardiac: Regular rate and rhythm, no murmurs rubs or gallops, no lower extremity edema.  Respiratory: Clear to auscultation bilaterally. Not using accessory muscles, speaking in full sentences. Right ankle: Palpable Achilles mass: Minimally tender, nitroglycerin overlying.  Impression and Recommendations:    S/P Achilles tendon repair  Doing well with custom orthotics with heel lifts, topical nitroglycerin, he will get more diligent with his rehabilitation exercises. I did explain this can take months to fully get better but overall his pain is gone.  Vasculogenic erectile dysfunction  Patient  experimented with a nitroglycerin patch at the base of his penis, seems to have worked very well and made it bigger.  I spent 25 minutes with this patient, greater than 50% was face-to-face time counseling regarding the above diagnoses

## 2016-11-07 NOTE — Patient Instructions (Addendum)
For migraines: - continue your Topamax daily - if you have any sleepiness/fatigue take your medicine at night - take Phenergan 1-2 tablets as needed for nausea/vomiting - okay to take Excedrin as needed for acute headaches, but do not take daily or will cause medication overuse headache  For scar: Apple Triamcinolone twice a day to your upper leg. Avoid contact with face and genitalia.   For your blood pressure: - Check blood pressure at home for the next 2 weeks - Check around the same time each day in a relaxed setting - Limit salt. Follow DASH eating plan - Follow-up in 2 weeks for a nurse visit BP check. Bring your log   Recurrent Migraine Headache Migraines are a type of headache, and they are usually stronger and more sudden than normal headaches (tension headaches). Migraines are characterized by an intense pulsing, throbbing pain that is usually only present on one side of the head. Sometimes, migraine headaches can cause nausea, vomiting, sensitivity to light and sound, and vision changes. Recurrent migraines keep coming back (recurring). A migraine can last from 4 hours up to 3 days. What are the causes? The exact cause of this condition is not known. However, a migraine may be caused when nerves in the brain become irritated and release chemicals that cause inflammation of blood vessels. This inflammation causes pain. Certain things may also trigger migraines, such as:  A disruption in your regular eating and sleeping schedule.  Smoking.  Stress.  Menstruation.  Certain foods and drinks, such as: ? Aged cheese. ? Chocolate. ? Alcohol. ? Caffeine. ? Foods or drinks that contain nitrates, glutamate, aspartame, MSG, or tyramine.  Lack of sleep.  Hunger.  Physical exertion.  Fatigue.  High altitude.  Weather changes.  Medicines, such as: ? Nitroglycerin, which is used to treat chest pain. ? Birth control pills. ? Estrogen. ? Some blood pressure  medicines.  What are the signs or symptoms? Symptoms of this condition vary for each person and may include:  Pain that is usually only present on one side of the head. In some cases, the pain may be on both sides of the head or around the head or neck.  Pulsating or throbbing pain.  Severe pain that prevents daily activities.  Pain that is aggravated by any physical activity.  Nausea, vomiting, or both.  Dizziness.  Pain with exposure to bright lights, loud noises, or activity.  General sensitivity to bright lights, loud noises, or smells.  Before you get a migraine, you may get warning signs that a migraine is coming (aura). An aura may include:  Seeing flashing lights.  Seeing bright spots, halos, or zigzag lines.  Having tunnel vision or blurred vision.  Having numbness or a tingling feeling.  Having trouble talking.  Having muscle weakness.  Smelling a certain odor.  How is this diagnosed? This condition is often diagnosed based on:  Your symptoms and medical history.  A physical exam.  You may also have tests, including:  A CT scan or MRI of your brain. These imaging tests cannot diagnose migraines, but they can help to rule out other causes of headaches.  Blood tests.  How is this treated? This condition is treated with:  Medicines. These are used for: ? Lessening pain and nausea. ? Preventing recurrent migraines.  Lifestyle changes, such as changes to your diet or sleeping patterns.  Behavior therapy, such as relaxation training or biofeedback. Biofeedback is a treatment that involves teaching you to relax and use  your brain to lower your heart rate and control your breathing.  Follow these instructions at home: Medicines  Take over-the-counter and prescription medicines only as told by your health care provider.  Do not drive or use heavy machinery while taking prescription pain medicine. Lifestyle  Do not use any products that contain  nicotine or tobacco, such as cigarettes and e-cigarettes. If you need help quitting, ask your health care provider.  Limit alcohol intake to no more than 1 drink a day for nonpregnant women and 2 drinks a day for men. One drink equals 12 oz of beer, 5 oz of wine, or 1 oz of hard liquor.  Get 7-9 hours of sleep each night, or the amount of sleep recommended by your health care provider.  Limit your stress. Talk with your health care provider if you need help with stress management.  Maintain a healthy weight. If you need help losing weight, ask your health care provider.  Exercise regularly. Aim for 150 minutes of moderate-intensity exercise (walking, biking, yoga) or 75 minutes of vigorous exercise (running, circuit training, swimming) each week. General instructions  Keep a journal to find out what triggers your migraine headaches so you can avoid these triggers. For example, write down: ? What you eat and drink. ? How much sleep you get. ? Any change to your diet or medicines.  Lie down in a dark, quiet room when you have a migraine.  Try placing a cool towel over your head when you have a migraine.  Keep lights dim, if bright lights bother you and make your migraines worse.  Keep all follow-up visits as told by your health care provider. This is important. Contact a health care provider if:  Your pain does not improve, even with medicine.  Your migraines continue to return, even with medicine.  You have a fever.  You have weight loss. Get help right away if:  Your migraine becomes severe and medicine does not help.  You have a stiff neck.  You have a loss of vision.  You have muscle weakness or loss of muscle control.  You start losing your balance or have trouble walking.  You feel faint or you pass out.  You develop new, severe symptoms.  You start having abrupt severe headaches that last for a second or less, like a thunderclap. Summary  Migraine headaches  are usually stronger and more sudden than normal headaches (tension headaches). Migraines are characterized by an intense pulsing, throbbing pain that is usually only present on one side of the head.  The exact cause of this condition is not known. However, a migraine may be caused when nerves in the brain become irritated and release chemicals that cause inflammation of blood vessels.  Certain things may trigger migraines, such as changes to diet or sleeping patterns, smoking, certain foods, alcohol, stress, and certain medicines.  Sometimes, migraine headaches can cause nausea, vomiting, sensitivity to light and sound, and vision changes.  Migraines are often diagnosed based on your symptoms, medical history, and a physical exam. This information is not intended to replace advice given to you by your health care provider. Make sure you discuss any questions you have with your health care provider. Document Released: 01/01/2001 Document Revised: 01/19/2016 Document Reviewed: 01/19/2016 Elsevier Interactive Patient Education  [employer]2018 [employer]

## 2016-11-07 NOTE — Assessment & Plan Note (Signed)
Doing well with custom orthotics with heel lifts, topical nitroglycerin, he will get more diligent with his rehabilitation exercises. I did explain this can take months to fully get better but overall his pain is gone.

## 2016-11-07 NOTE — Patient Instructions (Signed)
Begin with easy walking, heel, toe and backwards  Do calf raises on a step:  First lower and then raise on 1 foot  If this is painful, lower on 1 foot, do the heel raise on both feet Begin with 3 sets of 10 repetitions  Increase by 5 repetitions every 3 days  Goal is 3 sets of 30 repetitions  Do with both straight knee and knee at 20 degrees of flexion  If pain persists, once you can do 3 sets of 30 without weight, add backpack with 5 lbs.  Increase by 5 lbs per week to max of 30 lbs for 3 sets of 15   

## 2016-11-21 ENCOUNTER — Ambulatory Visit: Payer: 59

## 2016-12-09 ENCOUNTER — Ambulatory Visit: Payer: 59

## 2017-01-23 ENCOUNTER — Other Ambulatory Visit: Payer: Self-pay | Admitting: Physician Assistant

## 2017-01-23 DIAGNOSIS — G43009 Migraine without aura, not intractable, without status migrainosus: Secondary | ICD-10-CM

## 2017-02-07 ENCOUNTER — Ambulatory Visit: Payer: 59 | Admitting: Sports Medicine

## 2017-02-23 ENCOUNTER — Other Ambulatory Visit: Payer: Self-pay | Admitting: Physician Assistant

## 2017-02-23 DIAGNOSIS — G43009 Migraine without aura, not intractable, without status migrainosus: Secondary | ICD-10-CM

## 2017-05-21 ENCOUNTER — Other Ambulatory Visit: Payer: Self-pay | Admitting: Physician Assistant

## 2017-05-21 DIAGNOSIS — G43009 Migraine without aura, not intractable, without status migrainosus: Secondary | ICD-10-CM

## 2017-05-22 ENCOUNTER — Other Ambulatory Visit: Payer: Self-pay

## 2017-05-22 DIAGNOSIS — G43009 Migraine without aura, not intractable, without status migrainosus: Secondary | ICD-10-CM

## 2017-05-22 MED ORDER — TOPIRAMATE 50 MG PO TABS: 50.0000 mg | ORAL_TABLET | Freq: Every day | ORAL | 0 refills | Status: DC

## 2017-08-19 ENCOUNTER — Other Ambulatory Visit: Payer: Self-pay | Admitting: Physician Assistant

## 2017-08-19 DIAGNOSIS — G43009 Migraine without aura, not intractable, without status migrainosus: Secondary | ICD-10-CM

## 2017-10-19 ENCOUNTER — Other Ambulatory Visit: Payer: Self-pay | Admitting: Sports Medicine

## 2017-10-19 DIAGNOSIS — N529 Male erectile dysfunction, unspecified: Secondary | ICD-10-CM

## 2017-10-20 NOTE — Telephone Encounter (Signed)
To PCP

## 2017-11-14 ENCOUNTER — Other Ambulatory Visit: Payer: Self-pay | Admitting: Physician Assistant

## 2017-11-14 DIAGNOSIS — G43009 Migraine without aura, not intractable, without status migrainosus: Secondary | ICD-10-CM

## 2018-09-02 ENCOUNTER — Telehealth: Payer: Self-pay | Admitting: Physician Assistant

## 2018-09-02 NOTE — Telephone Encounter (Signed)
Received forms from [employer] If this is an FMLA request, please schedule patient for an e-visit for form completion If he is needing a disability evaluation/physical exam, schedule office visit

## 2018-09-04 ENCOUNTER — Ambulatory Visit (INDEPENDENT_AMBULATORY_CARE_PROVIDER_SITE_OTHER): Payer: 59 | Admitting: Physician Assistant

## 2018-09-04 ENCOUNTER — Encounter: Payer: Self-pay | Admitting: Physician Assistant

## 2018-09-04 DIAGNOSIS — Z8781 Personal history of (healed) traumatic fracture: Secondary | ICD-10-CM | POA: Diagnosis not present

## 2018-09-04 DIAGNOSIS — G44329 Chronic post-traumatic headache, not intractable: Secondary | ICD-10-CM | POA: Diagnosis not present

## 2018-09-04 DIAGNOSIS — M47812 Spondylosis without myelopathy or radiculopathy, cervical region: Secondary | ICD-10-CM | POA: Insufficient documentation

## 2018-09-04 DIAGNOSIS — M542 Cervicalgia: Secondary | ICD-10-CM

## 2018-09-04 DIAGNOSIS — G43009 Migraine without aura, not intractable, without status migrainosus: Secondary | ICD-10-CM

## 2018-09-04 DIAGNOSIS — Z0289 Encounter for other administrative examinations: Secondary | ICD-10-CM

## 2018-09-04 DIAGNOSIS — G8929 Other chronic pain: Secondary | ICD-10-CM

## 2018-09-04 MED ORDER — TOPIRAMATE ER 50 MG PO SPRINKLE CAP24: EXTENDED_RELEASE_CAPSULE | ORAL | 3 refills | Status: DC
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
## 2018-09-07 ENCOUNTER — Telehealth: Payer: Self-pay | Admitting: Physician Assistant

## 2018-09-07 DIAGNOSIS — G43709 Chronic migraine without aura, not intractable, without status migrainosus: Secondary | ICD-10-CM

## 2018-09-07 MED ORDER — TOPIRAMATE ER 50 MG PO CAP24: 50.0000 mg | ORAL_CAPSULE | Freq: Every day | ORAL | 0 refills | Status: DC

## 2018-09-07 NOTE — Telephone Encounter (Signed)
Patient called in and stated that the sprinkle capsule (around $700) was too expensive. I called the pharmacy and per Diedra the Topirmate Capsule is significantly cheaper. Please advise.

## 2018-09-07 NOTE — Telephone Encounter (Signed)
Approved today (Trokendi) Request Reference Number: [id number]. TROKENDI XR CAP 50MG  is approved through 09/07/2019. For further questions, call [phone]. Pharmacy aware.

## 2018-09-07 NOTE — Telephone Encounter (Signed)
Please work on PA for Trokendi Has already failed regular Topiramate

## 2018-09-10 ENCOUNTER — Telehealth: Payer: Self-pay | Admitting: Physician Assistant

## 2018-09-10 ENCOUNTER — Encounter: Payer: Self-pay | Admitting: Physician Assistant

## 2018-09-10 NOTE — Telephone Encounter (Signed)
Forms re-faxed with cover letter clarifying diagnosis Faxed to [facility] [phone]

## 2018-09-10 NOTE — Telephone Encounter (Signed)
error 

## 2018-09-21 ENCOUNTER — Ambulatory Visit (INDEPENDENT_AMBULATORY_CARE_PROVIDER_SITE_OTHER): Payer: 59

## 2018-09-21 ENCOUNTER — Encounter: Payer: Self-pay | Admitting: Sports Medicine

## 2018-09-21 ENCOUNTER — Telehealth: Payer: Self-pay

## 2018-09-21 ENCOUNTER — Other Ambulatory Visit: Payer: Self-pay

## 2018-09-21 ENCOUNTER — Ambulatory Visit (INDEPENDENT_AMBULATORY_CARE_PROVIDER_SITE_OTHER): Payer: 59 | Admitting: Sports Medicine

## 2018-09-21 DIAGNOSIS — M722 Plantar fascial fibromatosis: Secondary | ICD-10-CM

## 2018-09-21 DIAGNOSIS — M47812 Spondylosis without myelopathy or radiculopathy, cervical region: Secondary | ICD-10-CM

## 2018-09-21 MED ORDER — PREDNISONE 50 MG PO TABS: ORAL_TABLET | ORAL | 0 refills | Status: DC

## 2018-09-21 MED ORDER — MELOXICAM 15 MG PO TABS
ORAL_TABLET | ORAL | 3 refills | Status: DC
Start: 2018-09-21 — End: 2019-09-21
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
## 2018-09-21 NOTE — Telephone Encounter (Signed)
Copy of his FMLA should be under media.  You can print it off and amend the intermittent portion to read 2-4 times per month and 1-2 days per episode which would allow him 2-8 days per month

## 2018-09-21 NOTE — Assessment & Plan Note (Signed)
Needs a new set of custom orthotics. I would also like physical therapy to work on his heel. If no better in 4 to 6 weeks we will proceed with an injection.
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
## 2018-09-21 NOTE — Telephone Encounter (Signed)
Aaron Frey came by the office asking about his disability for migraines. He thought he would be able to take 4 separate days off for migraine episodes. I advised him it is up to 4 day but with only 2 episodes. He could be off up to 2 days per episodes. He stated he has migraines daily. I asked if he was taking the Trokendi. He states he read through all the side effects of the medication and decided not to take the medication. He states he feels like the migraines are coming from his neck pain. I scheduled him to see Dr Benjamin Stain for the neck pain. He is wanting to change to disability to 4 days. Please advise.

## 2018-09-22 ENCOUNTER — Ambulatory Visit: Payer: 59 | Admitting: Rehabilitative and Restorative Service Providers"

## 2018-09-22 NOTE — Telephone Encounter (Signed)
Updated and faxed disability forms. Patient aware.

## 2018-10-22 ENCOUNTER — Encounter: Payer: Self-pay | Admitting: Family Medicine

## 2018-10-22 ENCOUNTER — Ambulatory Visit (INDEPENDENT_AMBULATORY_CARE_PROVIDER_SITE_OTHER): Payer: 59 | Admitting: Family Medicine

## 2018-10-22 VITALS — BP 136/80 | HR 78 | Temp 97.7°F | Wt 226.0 lb

## 2018-10-22 DIAGNOSIS — S39012A Strain of muscle, fascia and tendon of lower back, initial encounter: Secondary | ICD-10-CM

## 2018-10-22 MED ORDER — CYCLOBENZAPRINE HCL 10 MG PO TABS: 10.0000 mg | ORAL_TABLET | Freq: Three times a day (TID) | ORAL | 0 refills | Status: DC | PRN

## 2018-10-22 NOTE — Patient Instructions (Signed)
Thank you for coming in today. Attend PT.  Use heating pad and TENS unit.  Ok to muscle releaxor however it may make you sleepy.  Recheck if not improving.   Come back or go to the emergency room if you notice new weakness new numbness problems walking or bowel or bladder problems.  TENS UNIT: This is helpful for muscle pain and spasm.   Search and Purchase a TENS 7000 2nd edition at  www.tenspros.com or www.Amazon.com It should be less than $30.     TENS unit instructions: Do not shower or bathe with the unit on Turn the unit off before removing electrodes or batteries If the electrodes lose stickiness add a drop of water to the electrodes after they are disconnected from the unit and place on plastic sheet. If you continued to have difficulty, call the TENS unit company to purchase more electrodes. Do not apply lotion on the skin area prior to use. Make sure the skin is clean and dry as this will help prolong the life of the electrodes. After use, always check skin for unusual red areas, rash or other skin difficulties. If there are any skin problems, does not apply electrodes to the same area. Never remove the electrodes from the unit by pulling the wires. Do not use the TENS unit or electrodes other than as directed. Do not change electrode placement without consultating your therapist or physician. Keep 2 fingers with between each electrode. Wear time ratio is 2:1, on to off times.    For example on for 30 minutes off for 15 minutes and then on for 30 minutes off for 15 minutes     Lumbosacral Strain Lumbosacral strain is an injury that causes pain in the lower back (lumbosacral spine). This injury usually occurs from overstretching the muscles or ligaments along your spine. A strain can affect one or more muscles or cord-like tissues that connect bones to other bones (ligaments). What are the causes? This condition may be caused by:  A hard, direct hit (blow) to the back.   Excessive stretching of the lower back muscles. This may result from: ? A fall. ? Lifting something heavy. ? Repetitive movements such as bending or crouching. What increases the risk? The following factors may increase your risk of getting this condition:  Participating in sports or activities that involve: ? A sudden twist of the back. ? Pushing or pulling motions.  Being overweight or obese.  Having poor strength and flexibility, especially tight hamstrings or weak muscles in the back or abdomen.  Having too much of a curve in the lower back.  Having a pelvis that is tilted forward. What are the signs or symptoms? The main symptom of this condition is pain in the lower back, at the site of the strain. Pain may extend (radiate) down one or both legs. How is this diagnosed? This condition is diagnosed based on:  Your symptoms.  Your medical history.  A physical exam. ? Your health care provider may push on certain areas of your back to determine the source of your pain. ? You may be asked to bend forward, backward, and side to side to assess the severity of your pain and your range of motion.  Imaging tests, such as: ? X-rays. ? MRI.  How is this treated? Treatment for this condition may include:  Putting heat and cold on the affected area.  Medicines to help relieve pain and relax your muscles (muscle relaxants).  NSAIDs to help  reduce swelling and discomfort. When your symptoms improve, it is important to gradually return to your normal routine as soon as possible to reduce pain, avoid stiffness, and avoid loss of muscle strength. Generally, symptoms should improve within 6 weeks of treatment. However, recovery time varies. Follow these instructions at home: Managing pain, stiffness, and swelling   If directed, put ice on the injured area during the first 24 hours after your strain. ? Put ice in a plastic bag. ? Place a towel between your skin and the bag. ?  Leave the ice on for 20 minutes, 2-3 times a day.  If directed, put heat on the affected area as often as told by your health care provider. Use the heat source that your health care provider recommends, such as a moist heat pack or a heating pad. ? Place a towel between your skin and the heat source. ? Leave the heat on for 20-30 minutes. ? Remove the heat if your skin turns bright red. This is especially important if you are unable to feel pain, heat, or cold. You may have a greater risk of getting burned. Activity  Rest and return to your normal activities as told by your health care provider. Ask your health care provider what activities are safe for you.  Avoid activities that take a lot of energy for as long as told by your health care provider. General instructions  Take over-the-counter and prescription medicines only as told by your health care provider.  Donot drive or use heavy machinery while taking prescription pain medicine.  Do not use any products that contain nicotine or tobacco, such as cigarettes and e-cigarettes. If you need help quitting, ask your health care provider.  Keep all follow-up visits as told by your health care provider. This is important. How is this prevented?  Use correct form when playing sports and lifting heavy objects.  Use good posture when sitting and standing.  Maintain a healthy weight.  Sleep on a mattress with medium firmness to support your back.  Be safe and responsible while being active to avoid falls.  Do at least 150 minutes of moderate-intensity exercise each week, such as brisk walking or water aerobics. Try a form of exercise that takes stress off your back, such as swimming or stationary cycling.  Maintain physical fitness, including: ? Strength. ? Flexibility. ? Cardiovascular fitness. ? Endurance. Contact a health care provider if:  Your back pain does not improve after 6 weeks of treatment.  Your symptoms get  worse. Get help right away if:  Your back pain is severe.  You cannot stand or walk.  You have difficulty controlling when you urinate or when you have a bowel movement.  You feel nauseous or you vomit.  Your feet get very cold.  You have numbness, tingling, weakness, or problems using your arms or legs.  You develop any of the following: ? Shortness of breath. ? Dizziness. ? Pain in your legs. ? Weakness in your buttocks or legs. ? Discoloration of the skin on your toes or legs. This information is not intended to replace advice given to you by your health care provider. Make sure you discuss any questions you have with your health care provider. Document Released: 01/16/2005 Document Revised: 07/31/2018 Document Reviewed: 09/10/2015 Elsevier Patient Education  2020 [employer].

## 2018-10-22 NOTE — Progress Notes (Signed)
Aaron Frey is a 58 y.o. male who presents to [facility] today for left low back and buttock.   Pain started yesterday morning when he got up from bed.  He felt a pulling sensation in his low back on the left side.  He was able to continue to move around at work yesterday with mild to moderate pain.  The pain was much more severe this morning.  He notes the pain is confined mostly to the left low back and buttocks.  He denies any pain radiating down the leg below the level of the knee.  He denies any bowel or bladder dysfunction or weakness or numbness distally.  He is tried meloxicam and Tylenol and Excedrin which have not helped much.  He denies any injury.  No fevers chills nausea vomiting or diarrhea.  ROS:  As above  Exam:  BP 136/80   Pulse 78   Temp 97.7 F (36.5 C) (Oral)   Wt 226 lb (102.5 kg)   BMI 30.65 kg/m  Wt Readings from Last 5 Encounters:  10/22/18 226 lb (102.5 kg)  09/21/18 237 lb (107.5 kg)  11/07/16 220 lb (99.8 kg)  11/07/16 220 lb (99.8 kg)  10/10/16 227 lb 4.8 oz (103.1 kg)   General: Well Developed, well nourished, and in no acute distress.  Neuro/Psych: Alert and oriented x3, extra-ocular muscles intact, able to move all 4 extremities, sensation grossly intact. Skin: Warm and dry, no rashes noted.  Respiratory: Not using accessory muscles, speaking in full sentences, trachea midline.  Cardiovascular: Pulses palpable, no extremity edema. Abdomen: Does not appear distended. MSK:  L-spine: Nontender to spinal midline.  Tender palpation left lumbar paraspinal musculature. Significant decreased lumbar motion to flexion.  Limited but not severely limited range of motion to rotation and lateral flexion and extension. Lower extremity strength is equal normal throughout bilateral extremities.  Reflexes equal bilaterally and normal.  Negative slump test bilaterally. Mild antalgic gait present.    Lab and  Radiology Results EXAM: CT CHEST, ABDOMEN, AND PELVIS WITH CONTRAST  TECHNIQUE: Multidetector CT imaging of the chest, abdomen and pelvis was performed following the standard protocol during bolus administration of intravenous contrast.  CONTRAST:  113mL OMNIPAQUE IOHEXOL 300 MG/ML  SOLN  COMPARISON:  None.  FINDINGS: CT CHEST FINDINGS  Normal heart size. Normal caliber thoracic aorta. No evidence of aneurysm or dissection. No abnormal mediastinal gas or fluid collection. Esophagus is decompressed. No significant lymphadenopathy in the chest. Subcutaneous emphysema in the right infraclavicular region. No associated rib fractures are suggested. This could crease filled from penetrating injury or may represent venous gas arising from intravenous injection. No pleural effusion. No pneumothorax. Airways appear patent. The lungs are clear.  CT ABDOMEN AND PELVIS FINDINGS  The liver, spleen, gallbladder, pancreas, adrenal glands, abdominal aorta, inferior vena cava, and retroperitoneal lymph nodes are unremarkable. Bilateral renal cysts, largest on the left measuring 3.6 cm diameter. No parenchymal injury or hydronephrosis. Stomach and small bowel are decompressed. Gas and stool filled colon without distention. No free air or free fluid in the abdomen. No abnormal mesenteric or retroperitoneal fluid collections. Abdominal wall musculature appears intact.  Pelvis: Bladder wall is not thickened. Prostate gland is not enlarged. No free or loculated pelvic fluid collections. Diverticula in the sigmoid colon without evidence of diverticulitis. There is fatty infiltration over the gluteal muscle regions bilaterally as well as focally in the left lower pelvis. This likely represents contusion. Soft tissue hematoma demonstrated lateral  to the left hip.  Bones: Normal alignment of the thoracic and lumbar spine. No vertebral compression deformities. Mild degenerative changes are  present. Sternum appears intact. Visualized portions of the clavicles and shoulders appear intact. No displaced rib fractures are appreciated. Mildly displaced fractures of the left transverse processes of L3 and L4 vertebrae. Mildly displaced fracture of the sacral coccygeal junction. Pelvis and hips appear intact without evidence of displaced fracture.  IMPRESSION: Fractures of the left transverse processes of L3 and L4. Mildly displaced fracture of the sacrococcygeal junction. No evidence of thoracic aortic injury or pulmonary contusion. No evidence of solid organ injury or bowel perforation. The contusions and/or hematoma is in the left lower quadrant pelvic fat and over the gluteal regions bilaterally. Nonspecific subcutaneous emphysema in the right infraclavicular region.   Electronically Signed   By: Burman NievesWilliam  Stevens M.D.   On: 12/29/2013 02:31  I personally (independently) visualized and performed the interpretation of the images attached in this note.]  Independently reviewed L-spine imaging.  Assessment and Plan: 58 y.o. male with left low back pain.  Acute onset of pain likely consistent with lumbosacral strain.  Patient does have x-ray of transverse process of L3-4 seen on CT scan from 2015 attached above.  However he does not have any point tenderness along spinal midline and had been effectively close to pain-free or pain-free completely prior to yesterday.  His symptoms are much more consistent with lumbosacral strain.  Plan to proceed with physical therapy heating pad TENS unit and Flexeril.  Recheck if no improvement.  Likely on recheck if needed neck step would be x-ray L-spine.  Precautions reviewed.  Work note provided.   PDMP not reviewed this encounter. No orders of the defined types were placed in this encounter.  No orders of the defined types were placed in this encounter.   Historical information moved to improve visibility of documentation.  Past  Medical History:  Diagnosis Date  . C2 cervical fracture (HCC) 12/29/2013  . C5 vertebral fracture (HCC) 12/29/2013  . Class 1 obesity due to excess calories without serious comorbidity with body mass index (BMI) of 32.0 to 32.9 in adult 08/30/2016  . Concussion 12/29/2013  . Former tobacco use   . Hypertension   . Hypertriglyceridemia without hypercholesterolemia 09/03/2016  . Lumbar transverse process fracture (HCC) 12/29/2013  . Migraine   . Sacrum and coccyx fracture (HCC) 12/29/2013   Past Surgical History:  Procedure Laterality Date  . ACHILLES TENDON REPAIR     Social History   Tobacco Use  . Smoking status: Former Smoker    Packs/day: 0.10    Types: Cigarettes    Quit date: 2015    Years since quitting: 5.5  . Smokeless tobacco: Never Used  . Tobacco comment: 3-4 cigarettes/day  Substance Use Topics  . Alcohol use: Yes    Comment: Occasionally   family history includes ALS in his father; Cancer in his mother; Dementia in his mother; Stroke in his mother.  Medications: Current Outpatient Medications  Medication Sig Dispense Refill  . aspirin-acetaminophen-caffeine (EXCEDRIN MIGRAINE) 250-250-65 MG tablet Take by mouth every 6 (six) hours as needed.    . meloxicam (MOBIC) 15 MG tablet One tab PO qAM with breakfast for 2 weeks, then daily prn pain. 30 tablet 3   No current facility-administered medications for this visit.    No Known Allergies    Discussed warning signs or symptoms. Please see discharge instructions. Patient expresses understanding.

## 2018-10-26 ENCOUNTER — Telehealth: Payer: Self-pay | Admitting: Physician Assistant

## 2018-10-26 NOTE — Telephone Encounter (Signed)
Ask employer for short-term disability form to be sent to me.  Additionally attend physical therapy as this is likely to help you get better.  I can write a new work note if needed as well.

## 2018-10-26 NOTE — Telephone Encounter (Signed)
Patient states that he can not stand for a long period of time and per the note from [place] he would have to go back to work tomorrow. Patient would like to know how to get the process started to get short term disability because he doesn't think he can go back to work right now. Please contact and advise.

## 2018-10-26 NOTE — Telephone Encounter (Signed)
Aaron Frey this patient will need to contact his employer HR for the short term disability. Once he has talked to them and knows what forms need filling out then he can call and make appointment to have filled oput.  Thank You- Maudie Mercury

## 2018-10-27 ENCOUNTER — Ambulatory Visit (INDEPENDENT_AMBULATORY_CARE_PROVIDER_SITE_OTHER): Payer: 59 | Admitting: Family Medicine

## 2018-10-27 ENCOUNTER — Other Ambulatory Visit: Payer: Self-pay

## 2018-10-27 ENCOUNTER — Ambulatory Visit (INDEPENDENT_AMBULATORY_CARE_PROVIDER_SITE_OTHER): Payer: 59

## 2018-10-27 ENCOUNTER — Encounter: Payer: Self-pay | Admitting: Family Medicine

## 2018-10-27 VITALS — BP 147/83 | HR 80 | Temp 98.1°F | Ht 72.0 in | Wt 229.0 lb

## 2018-10-27 DIAGNOSIS — M545 Low back pain, unspecified: Secondary | ICD-10-CM

## 2018-10-27 NOTE — Telephone Encounter (Signed)
Pt has appt today

## 2018-10-27 NOTE — Patient Instructions (Addendum)
Thank you for coming in today. Attend PT.  If not better we will want to do labs and urine tests.  Keep me updated.   Recheck in 3 weeks if not better.   If your belly pain worsens, or you have high fever, bad vomiting, blood in your stool or black tarry stool go to the Emergency Room.   Come back or go to the emergency room if you notice new weakness new numbness problems walking or bowel or bladder problems.
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
## 2018-10-28 ENCOUNTER — Ambulatory Visit: Payer: 59 | Admitting: Rehabilitative and Restorative Service Providers"

## 2018-10-28 ENCOUNTER — Telehealth: Payer: Self-pay | Admitting: Neurology

## 2018-10-28 NOTE — Telephone Encounter (Signed)
[employer] forms completed and faxed to [phone] with confirmation received.

## 2018-11-16 ENCOUNTER — Ambulatory Visit (INDEPENDENT_AMBULATORY_CARE_PROVIDER_SITE_OTHER): Payer: 59 | Admitting: Physical Therapy

## 2018-11-16 ENCOUNTER — Encounter: Payer: Self-pay | Admitting: Family Medicine

## 2018-11-16 ENCOUNTER — Telehealth: Payer: Self-pay | Admitting: Family Medicine

## 2018-11-16 ENCOUNTER — Encounter: Payer: Self-pay | Admitting: Physical Therapy

## 2018-11-16 ENCOUNTER — Other Ambulatory Visit: Payer: Self-pay

## 2018-11-16 DIAGNOSIS — M25552 Pain in left hip: Secondary | ICD-10-CM

## 2018-11-16 DIAGNOSIS — M545 Low back pain, unspecified: Secondary | ICD-10-CM

## 2018-11-16 NOTE — Telephone Encounter (Signed)
Received more information request for [employer] regarding disability claim. Had conversation with Aaron Frey.  Plan to continue physical therapy and submit new form.  Scheduled next office visit for August 10..  Patient is having some improvement but continued difficulty with lifting.  He is able to do light activities at home but not able to lift anywhere near the 75 pounds required by his job at this time.

## 2018-11-16 NOTE — Patient Instructions (Signed)
Access Code: [email or web address]  URL: [email or web address]  Date: 11/16/2018  Prepared by: Lyndee Hensen   Exercises Single Knee to Chest Stretch - 3 reps - 30 hold - 2x daily Supine Piriformis Stretch Pulling Heel to Hip - 3 reps - 30 hold - 2x daily Seated Piriformis Stretch with Trunk Bend - 3 reps - 30 hold - 2x daily Standing Sidebending with Chair Support - 3 reps - 30 hold - 2x daily Gastroc Stretch on Wall - 3 reps - 30 hold - 2x daily Supine Ankle Pumps - 10 reps - 2 sets - 2x daily
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
## 2018-11-18 ENCOUNTER — Telehealth: Payer: Self-pay | Admitting: Physician Assistant

## 2018-11-18 NOTE — Telephone Encounter (Signed)
Pt called. He spoke with  [employer] yesterday.  His job is expecting him to return to work on July 31st but he's not supposed to be back to work until the  17th. He wants to know if letter was sent about his return date to work? He has an appointment with you on August 10th. He's been doing PT but he tried to lift something at the house.. he's still not feeling right.

## 2018-11-18 NOTE — Telephone Encounter (Signed)
I kept the original forms and will send them again. Forms are in [place] in basket

## 2018-11-18 NOTE — Telephone Encounter (Signed)
I did write a letter and filled out a form on 27 July stating this.  It is possible that they have not processed it yet.  I can send the form again in the letter again if needed.  Let me know what you need me to do.

## 2018-11-18 NOTE — Telephone Encounter (Signed)
[employer] did not get forms, please refax.

## 2018-11-18 NOTE — Telephone Encounter (Signed)
Pt will check with [employer] and give [place] a call back.

## 2018-11-19 ENCOUNTER — Encounter: Payer: Self-pay | Admitting: Physical Therapy

## 2018-11-19 ENCOUNTER — Other Ambulatory Visit: Payer: Self-pay

## 2018-11-19 ENCOUNTER — Ambulatory Visit: Payer: 59 | Admitting: Physical Therapy

## 2018-11-19 DIAGNOSIS — M545 Low back pain, unspecified: Secondary | ICD-10-CM

## 2018-11-19 DIAGNOSIS — M25552 Pain in left hip: Secondary | ICD-10-CM

## 2018-11-19 NOTE — Telephone Encounter (Signed)
Thanks

## 2018-11-19 NOTE — Patient Instructions (Signed)
Trigger Point Dry Needling  . What is Trigger Point Dry Needling (DN)? o DN is a physical therapy technique used to treat muscle pain and dysfunction. Specifically, DN helps deactivate muscle trigger points (muscle knots).  o A thin filiform needle is used to penetrate the skin and stimulate the underlying trigger point. The goal is for a local twitch response (LTR) to occur and for the trigger point to relax. No medication of any kind is injected during the procedure.   . What Does Trigger Point Dry Needling Feel Like?  o The procedure feels different for each individual patient. Some patients report that they do not actually feel the needle enter the skin and overall the process is not painful. Very mild bleeding may occur. However, many patients feel a deep cramping in the muscle in which the needle was inserted. This is the local twitch response.   Marland Kitchen How Will I feel after the treatment? o Soreness is normal, and the onset of soreness may not occur for a few hours. Typically this soreness does not last longer than two days.  o Bruising is uncommon, however; ice can be used to decrease any possible bruising.  o In rare cases feeling tired or nauseous after the treatment is normal. In addition, your symptoms may get worse before they get better, this period will typically not last longer than 24 hours.   . What Can I do After My Treatment? o Increase your hydration by drinking more water for the next 24 hours. o You may place ice or heat on the areas treated that have become sore, however, do not use heat on inflamed or bruised areas. Heat often brings more relief post needling. o You can continue your regular activities, but vigorous activity is not recommended initially after the treatment for 24 hours. o DN is best combined with other physical therapy such as strengthening, stretching, and other therapies.     Madelyn Flavors, PT 11/19/18 11:41 AM  [facility] [address] [place], [place] [address]  [phone] (office) [phone] (fax)
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
## 2018-11-20 NOTE — Telephone Encounter (Signed)
Thank you :)

## 2018-11-20 NOTE — Telephone Encounter (Signed)
Form re-faxed on 11/19/18 and returned to Dr Clovis Riley office. Fax confirmation was received.

## 2018-11-23 ENCOUNTER — Other Ambulatory Visit: Payer: Self-pay

## 2018-11-23 ENCOUNTER — Ambulatory Visit: Payer: 59 | Admitting: Physical Therapy

## 2018-11-23 DIAGNOSIS — M25552 Pain in left hip: Secondary | ICD-10-CM | POA: Diagnosis not present

## 2018-11-23 DIAGNOSIS — M545 Low back pain, unspecified: Secondary | ICD-10-CM
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
## 2018-11-26 ENCOUNTER — Other Ambulatory Visit: Payer: Self-pay

## 2018-11-26 ENCOUNTER — Encounter: Payer: Self-pay | Admitting: Rehabilitative and Restorative Service Providers"

## 2018-11-26 ENCOUNTER — Ambulatory Visit: Payer: 59 | Admitting: Rehabilitative and Restorative Service Providers"

## 2018-11-26 DIAGNOSIS — M25552 Pain in left hip: Secondary | ICD-10-CM | POA: Diagnosis not present

## 2018-11-26 DIAGNOSIS — M545 Low back pain, unspecified: Secondary | ICD-10-CM
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
## 2018-11-30 ENCOUNTER — Encounter: Payer: Self-pay | Admitting: Family Medicine

## 2018-11-30 ENCOUNTER — Encounter: Payer: 59 | Admitting: Physical Therapy

## 2018-11-30 ENCOUNTER — Ambulatory Visit (INDEPENDENT_AMBULATORY_CARE_PROVIDER_SITE_OTHER): Payer: 59

## 2018-11-30 ENCOUNTER — Other Ambulatory Visit: Payer: Self-pay

## 2018-11-30 ENCOUNTER — Ambulatory Visit (INDEPENDENT_AMBULATORY_CARE_PROVIDER_SITE_OTHER): Payer: 59 | Admitting: Family Medicine

## 2018-11-30 VITALS — BP 165/79 | HR 103 | Temp 97.9°F | Wt 230.0 lb

## 2018-11-30 DIAGNOSIS — M25552 Pain in left hip: Secondary | ICD-10-CM

## 2018-11-30 DIAGNOSIS — M545 Low back pain, unspecified: Secondary | ICD-10-CM

## 2018-11-30 NOTE — Progress Notes (Signed)
Aaron Frey is a 58 y.o. male who [facility] for [facility] pain.   Patient has been experiencing pain in his low back since injury earlier this year.  He was first seen for this problem on July 2 where he was thought to have a lumbosacral strain.  He had x-rays which showed some evidence of old fracture but no acute changes.  He has been seen by physical therapy now several times.  Overall he is has significant improvement in his pain.  However he has significant difficulty and pain especially with lifting.  He does not think he is able to go back to full heavy duty lifting required at work.  Additionally he notes some pain in the left groin and anterior hip.  This is been on going now for a few weeks.  He is not sure of any exacerbating factors but wonders if the physical therapy exercises or changes in his normal body motion due to his back pain may have caused his hip pain.  He notes pain is present especially with flexion activities such as putting his socks on.   ROS:  As above  Exam:  BP (!) 165/79   Pulse (!) 103   Temp 97.9 F (36.6 C) (Oral)   Wt 230 lb (104.3 kg)   BMI 31.19 kg/m  Wt Readings from Last 5 Encounters:  11/30/18 230 lb (104.3 kg)  10/27/18 229 lb (103.9 kg)  10/22/18 226 lb (102.5 kg)  09/21/18 237 lb (107.5 kg)  11/07/16 220 lb (99.8 kg)   General: Well Developed, well nourished, and in no acute distress.  Neuro/Psych: Alert and oriented x3, extra-ocular muscles intact, able to move all 4 extremities, sensation grossly intact. Skin: Warm and dry, no rashes noted.  Respiratory: Not using accessory muscles, speaking in full sentences, trachea midline.  Cardiovascular: Pulses palpable, no extremity edema. Abdomen: Does not appear distended. MSK:  L-spine: Nontender to spinal midline. Relatively normal spinal motion. Some pain with extension from a flexed position.  Left hip: Normal-appearing  not particularly tender.  Range of motion limited flexion and internal rotation with pain. Intact strength.    Lab and Radiology Results X-ray left hip images personally independently reviewed.  Minimal degenerative changes.  No acute fractures.  No significant AVN. Await formal radiology overread     Assessment and Plan: 58 y.o. male with back strain: Improving but not sufficiently recovered to return to work.  Patient has very demanding physical job and currently cannot return to work full duty.  Could do some light duty but not available at his current employer.  Will continue physical therapy and reassess in 2 weeks.  Hip pain.  New issue today.  Patient has minimal degenerative changes.  Is not obvious to me if it is an articular hip pain or hip flexor strain or spasm.  Plan to continue physical therapy and modify physical therapy activities.  Check back in 2 weeks.  If not improving will proceed with diagnostic interarticular injection.   PDMP not reviewed this encounter. Orders Placed This Encounter  Procedures  . DG HIP UNILAT WITH PELVIS 2-3 VIEWS LEFT    Standing Status:   Future    Number of Occurrences:   1    Standing Expiration Date:   01/30/2020    Order Specific Question:   Reason for Exam (SYMPTOM  OR DIAGNOSIS REQUIRED)    Answer:   eval pain left hip    Order  Specific Question:   Preferred imaging location?    Answer:   [facility]    Order Specific Question:   Radiology Contrast Protocol - do NOT remove file path    Answer:   [email or web address]  . Ambulatory referral to Physical Therapy    Referral Priority:   Routine    Referral Type:   Physical Medicine    Referral Reason:   Specialty Services Required    Requested Specialty:   Physical Therapy   No orders of the defined types were placed in this encounter.   Historical information moved to improve visibility of documentation.  Past Medical History:   Diagnosis Date  . C2 cervical fracture ([place]) 12/29/2013  . C5 vertebral fracture ([place]) 12/29/2013  . Class 1 obesity due to excess calories without serious comorbidity with body mass index (BMI) of 32.0 to 32.9 in adult 08/30/2016  . Concussion 12/29/2013  . Former tobacco use   . Hypertension   . Hypertriglyceridemia without hypercholesterolemia 09/03/2016  . Lumbar transverse process fracture ([place]) 12/29/2013  . Migraine   . Sacrum and coccyx fracture ([place]) 12/29/2013   Past Surgical History:  Procedure Laterality Date  . ACHILLES TENDON REPAIR     Social History   Tobacco Use  . Smoking status: Former Smoker    Packs/day: 0.10    Types: Cigarettes    Quit date: 2015    Years since quitting: 5.6  . Smokeless tobacco: Never Used  . Tobacco comment: 3-4 cigarettes/day  Substance Use Topics  . Alcohol use: Yes    Comment: Occasionally   family history includes ALS in his father; Cancer in his mother; Dementia in his mother; Stroke in his mother.  Medications: Current Outpatient Medications  Medication Sig Dispense Refill  . aspirin-acetaminophen-caffeine (EXCEDRIN MIGRAINE) 250-250-65 MG tablet Take by mouth every 6 (six) hours as needed.    . cyclobenzaprine (FLEXERIL) 10 MG tablet Take 1 tablet (10 mg total) by mouth 3 (three) times daily as needed for muscle spasms. 30 tablet 0  . meloxicam (MOBIC) 15 MG tablet One tab PO qAM with breakfast for 2 weeks, then daily prn pain. 30 tablet 3   No current facility-administered medications for this visit.    No Known Allergies    Discussed warning signs or symptoms. Please see discharge instructions. Patient expresses understanding.

## 2018-11-30 NOTE — Patient Instructions (Signed)
Thank you for coming in today. Recheck in 2 weeks.  I will re-modify FMLA.  Have them send me now information.

## 2018-12-03 ENCOUNTER — Other Ambulatory Visit: Payer: Self-pay

## 2018-12-03 ENCOUNTER — Encounter (INDEPENDENT_AMBULATORY_CARE_PROVIDER_SITE_OTHER): Payer: Self-pay

## 2018-12-03 ENCOUNTER — Encounter: Payer: Self-pay | Admitting: Physical Therapy

## 2018-12-03 ENCOUNTER — Ambulatory Visit: Payer: 59 | Admitting: Physical Therapy

## 2018-12-03 DIAGNOSIS — M545 Low back pain, unspecified: Secondary | ICD-10-CM

## 2018-12-03 DIAGNOSIS — M25552 Pain in left hip: Secondary | ICD-10-CM
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
## 2018-12-06 ENCOUNTER — Other Ambulatory Visit: Payer: Self-pay | Admitting: Physician Assistant

## 2018-12-06 DIAGNOSIS — G43709 Chronic migraine without aura, not intractable, without status migrainosus: Secondary | ICD-10-CM

## 2018-12-09 ENCOUNTER — Encounter: Payer: Self-pay | Admitting: Physical Therapy

## 2018-12-09 ENCOUNTER — Other Ambulatory Visit: Payer: Self-pay

## 2018-12-09 ENCOUNTER — Ambulatory Visit: Payer: 59 | Admitting: Physical Therapy

## 2018-12-09 DIAGNOSIS — M25552 Pain in left hip: Secondary | ICD-10-CM | POA: Diagnosis not present

## 2018-12-09 DIAGNOSIS — M545 Low back pain, unspecified: Secondary | ICD-10-CM
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
## 2018-12-16 ENCOUNTER — Encounter: Payer: Self-pay | Admitting: Family Medicine

## 2018-12-16 ENCOUNTER — Ambulatory Visit (INDEPENDENT_AMBULATORY_CARE_PROVIDER_SITE_OTHER): Payer: 59 | Admitting: Family Medicine

## 2018-12-16 VITALS — BP 154/74 | HR 78 | Temp 98.3°F | Ht 72.0 in | Wt 231.0 lb

## 2018-12-16 DIAGNOSIS — M545 Low back pain, unspecified: Secondary | ICD-10-CM

## 2018-12-16 DIAGNOSIS — M25552 Pain in left hip: Secondary | ICD-10-CM

## 2018-12-16 NOTE — Patient Instructions (Signed)
Thank you for coming in today. Continue to advance activity as tolerated.  Plan to return to work on as scheduled on 9/7.   Let me know if anything changes.   Recheck with me as needed.

## 2018-12-16 NOTE — Progress Notes (Signed)
Aaron Frey is a 58 y.o. male who [facility] today for hip and back pain.  Patient has been seen previously for back and hip pain.  His last visit on August 10 he was experiencing some hip pain that was thought to be possibly an articular degenerative and also possibly hip flexor strain.  He had 2 visits with physical therapy in the interim.  His last visit was on August 19.  During that visit he was able to complete box left of 37 pounds and simulate cardio without increase in pain.  He had decreased pain by the end of the session.  Decision was made to hold off on physical therapy for 30 days while working on home exercise program.   He has been advancing his activity and exercising independently now.  He notes he is having some aches and pains but overall is feeling pretty good.  He scheduled to return to work on September 7 and thinks it will be able to return to work at that time.  ROS:  As above  Exam:  BP (!) 154/74   Pulse 78   Temp 98.3 F (36.8 C) (Oral)   Ht 6' (1.829 m)   Wt 231 lb (104.8 kg)   BMI 31.33 kg/m  Wt Readings from Last 5 Encounters:  12/16/18 231 lb (104.8 kg)  11/30/18 230 lb (104.3 kg)  10/27/18 229 lb (103.9 kg)  10/22/18 226 lb (102.5 kg)  09/21/18 237 lb (107.5 kg)   General: Well Developed, well nourished, and in no acute distress.  Neuro/Psych: Alert and oriented x3, extra-ocular muscles intact, able to move all 4 extremities, sensation grossly intact. Skin: Warm and dry, no rashes noted.  Respiratory: Not using accessory muscles, speaking in full sentences, trachea midline.  Cardiovascular: Pulses palpable, no extremity edema. Abdomen: Does not appear distended. MSK:  L-spine nontender to midline normal lumbar motion. Hips bilaterally normal-appearing normal motion nontender.  Normal gait.      Assessment and Plan: 58 y.o. male with hip and back pain.  Significant improvement  with physical therapy.  Continue to advance activity with independent exercise.  Patient has a physically demanding job.  Estimate that he should be able to return to work as scheduled on September 7.  Letter written to return to work on September 7 with no restrictions. Recheck back with me as needed.  PDMP not reviewed this encounter. No orders of the defined types were placed in this encounter.  No orders of the defined types were placed in this encounter.   Historical information moved to improve visibility of documentation.  Past Medical History:  Diagnosis Date  . C2 cervical fracture (HCC) 12/29/2013  . C5 vertebral fracture (HCC) 12/29/2013  . Class 1 obesity due to excess calories without serious comorbidity with body mass index (BMI) of 32.0 to 32.9 in adult 08/30/2016  . Concussion 12/29/2013  . Former tobacco use   . Hypertension   . Hypertriglyceridemia without hypercholesterolemia 09/03/2016  . Lumbar transverse process fracture (HCC) 12/29/2013  . Migraine   . Sacrum and coccyx fracture (HCC) 12/29/2013   Past Surgical History:  Procedure Laterality Date  . ACHILLES TENDON REPAIR     Social History   Tobacco Use  . Smoking status: Former Smoker    Packs/day: 0.10    Types: Cigarettes    Quit date: 2015    Years since quitting: 5.6  . Smokeless tobacco: Never Used  . Tobacco comment:  3-4 cigarettes/day  Substance Use Topics  . Alcohol use: Yes    Comment: Occasionally   family history includes ALS in his father; Cancer in his mother; Dementia in his mother; Stroke in his mother.  Medications: Current Outpatient Medications  Medication Sig Dispense Refill  . aspirin-acetaminophen-caffeine (EXCEDRIN MIGRAINE) 250-250-65 MG tablet Take by mouth every 6 (six) hours as needed.    . cyclobenzaprine (FLEXERIL) 10 MG tablet Take 1 tablet (10 mg total) by mouth 3 (three) times daily as needed for muscle spasms. 30 tablet 0  . meloxicam (MOBIC) 15 MG tablet One tab PO qAM with  breakfast for 2 weeks, then daily prn pain. 30 tablet 3   No current facility-administered medications for this visit.    No Known Allergies    Discussed warning signs or symptoms. Please see discharge instructions. Patient expresses understanding.

## 2018-12-17 ENCOUNTER — Ambulatory Visit: Payer: 59 | Admitting: Family Medicine

## 2019-09-21 ENCOUNTER — Ambulatory Visit (INDEPENDENT_AMBULATORY_CARE_PROVIDER_SITE_OTHER): Payer: 59 | Admitting: Family Medicine

## 2019-09-21 ENCOUNTER — Encounter: Payer: Self-pay | Admitting: Family Medicine

## 2019-09-21 DIAGNOSIS — I10 Essential (primary) hypertension: Secondary | ICD-10-CM

## 2019-09-21 DIAGNOSIS — G43709 Chronic migraine without aura, not intractable, without status migrainosus: Secondary | ICD-10-CM

## 2019-09-21 MED ORDER — TOPIRAMATE 50 MG PO TABS: ORAL_TABLET | ORAL | 1 refills | Status: DC

## 2019-09-21 NOTE — Assessment & Plan Note (Signed)
Increased frequency.  I think headaches are multifactorial.  He does have baseline migraine disorder however likely has component of tension headaches from prior injury and analgesic rebound from frequent excedrin use.   Discussed cutting back on excedrin use.   Adding topiramate back on.  Recommend increased fluids and rest.    F/u in 6 weeks.

## 2019-09-21 NOTE — Progress Notes (Signed)
Aaron Frey will call back with COVID vaccination information.   MyChart Activation code sent via text per request.   Requesting restarting Topiramate for migraines.

## 2019-09-21 NOTE — Assessment & Plan Note (Signed)
BP elevated today.  No medications at this time, will recheck at f/uin 6 weeks. If remains elevated we discussed need for medication to be added.

## 2019-09-21 NOTE — Patient Instructions (Signed)
Start topiramate as directed  Work on cutting back on excedrin use.  See me again in 6 weeks.    Analgesic Rebound Headache An analgesic rebound headache, sometimes called a medication overuse headache, is a headache that comes after pain medicine (analgesic) taken to treat the original (primary) headache has worn off. Any type of primary headache can return as a rebound headache if a person regularly takes analgesics more than three times a week to treat it. The types of primary headaches that are commonly associated with rebound headaches include:  Migraines.  Headaches that arise from tense muscles in the head and neck area (tension headaches).  Headaches that develop and happen again (recur) on one side of the head and around the eye (cluster headaches). If rebound headaches continue, they become chronic daily headaches. What are the causes? This condition may be caused by frequent use of:  Over-the-counter medicines such as aspirin, ibuprofen, and acetaminophen.  Sinus relief medicines and other medicines that contain caffeine.  Narcotic pain medicines such as codeine and oxycodone. What are the signs or symptoms? The symptoms of a rebound headache are the same as the symptoms of the original headache. Some of the symptoms of specific types of headaches include: Migraine headache  Pulsing or throbbing pain on one or both sides of the head.  Severe pain that interferes with daily activities.  Pain that is worsened by physical activity.  Nausea, vomiting, or both.  Pain with exposure to bright light, loud noises, or strong smells.  General sensitivity to bright light, loud noises, or strong smells.  Visual changes.  Numbness of one or both arms. Tension headache  Pressure around the head.  Dull, aching head pain.  Pain felt over the front and sides of the head.  Tenderness in the muscles of the head, neck, and shoulders. Cluster headache  Severe pain that begins  in or around one eye or temple.  Redness and tearing in the eye on the same side as the pain.  Droopy or swollen eyelid.  One-sided head pain.  Nausea.  Runny nose.  Sweaty, pale facial skin.  Restlessness. How is this diagnosed? This condition is diagnosed by:  Reviewing your medical history. This includes the nature of your primary headaches.  Reviewing the types of pain medicines that you have been using to treat your headaches and how often you take them. How is this treated? This condition may be treated or managed by:  Discontinuing frequent use of the analgesic medicine. Doing this may worsen your headaches at first, but the pain should eventually become more manageable, less frequent, and less severe.  Seeing a headache specialist. He or she may be able to help you manage your headaches and help make sure there is not another cause of the headaches.  Using methods of stress relief, such as acupuncture, counseling, biofeedback, and massage. Talk with your health care provider about which methods might be good for you. Follow these instructions at home:  Take over-the-counter and prescription medicines only as told by your health care provider.  Stop the repeated use of pain medicine as told by your health care provider. Stopping can be difficult. Carefully follow instructions from your health care provider.  Avoid triggers that are known to cause your primary headaches.  Keep all follow-up visits as told by your health care provider. This is important. Contact a health care provider if:  You continue to experience headaches after following treatments that your health care provider recommended. Get  help right away if:  You develop new headache pain.  You develop headache pain that is different than what you have experienced in the past.  You develop numbness or tingling in your arms or legs.  You develop changes in your speech or vision. This information is not  intended to replace advice given to you by your health care provider. Make sure you discuss any questions you have with your health care provider. Document Revised: 03/21/2017 Document Reviewed: 09/11/2015 Elsevier Patient Education  2020 [employer].
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
## 2019-10-15 ENCOUNTER — Telehealth: Payer: Self-pay | Admitting: Family Medicine

## 2019-10-15 NOTE — Telephone Encounter (Signed)
PT left voicemail asking about FMLA paperwork. Seems like they haven't received it. Was the paperwork sent to scanning or given to the front desk to put in system same day?   Sadly can't locate paperwork in the chart.

## 2019-10-18 NOTE — Telephone Encounter (Signed)
A copy of the fax and documentation has been given to Cadott. He will further contact the patient.

## 2019-11-02 ENCOUNTER — Ambulatory Visit: Payer: 59 | Admitting: Family Medicine

## 2020-01-14 ENCOUNTER — Ambulatory Visit (INDEPENDENT_AMBULATORY_CARE_PROVIDER_SITE_OTHER): Payer: 59 | Admitting: Family Medicine

## 2020-01-14 ENCOUNTER — Encounter: Payer: Self-pay | Admitting: Family Medicine

## 2020-01-14 ENCOUNTER — Ambulatory Visit: Payer: 59 | Admitting: Medical-Surgical

## 2020-01-14 VITALS — BP 159/89 | HR 66 | Temp 98.6°F | Wt 238.6 lb

## 2020-01-14 DIAGNOSIS — H43391 Other vitreous opacities, right eye: Secondary | ICD-10-CM | POA: Diagnosis not present

## 2020-01-14 DIAGNOSIS — I10 Essential (primary) hypertension: Secondary | ICD-10-CM

## 2020-01-14 DIAGNOSIS — H539 Unspecified visual disturbance: Secondary | ICD-10-CM | POA: Diagnosis not present

## 2020-01-14 MED ORDER — AMLODIPINE BESYLATE 5 MG PO TABS: 5.0000 mg | ORAL_TABLET | Freq: Every day | ORAL | 3 refills | Status: DC

## 2020-01-14 NOTE — Patient Instructions (Signed)
Eye Floaters  Eye floaters are spots in your vision caused by shadows from specks of material that float around inside your eye. This is usually a normal, age-related change in your eye. Floaters may be more obvious when you look up at the sky or at a bright, blank background. Floaters can be annoying, but they do not usually cause vision problems. While floaters may be a normal part of aging, it is important to get an eye exam to make sure that floaters are not a sign of a more serious condition. What are the causes? In most cases, this condition is caused by age-related changes in the eye. As you age, the jelly-like fluid (vitreous) inside the eyeball shrinks and can become stringy. The stringy strands of vitreous cast shadows on the back of the eye (retina), which is where the nerves needed for vision are located. These shadows show up as floaters in your vision. Other possible causes of eye floaters include:  A torn retina.  Injury.  Bleeding inside the eye. Diabetes and other conditions can cause blood vessels in the retina to bleed.  A blood clot in the major vein of the retina or its branches (retinal vein occlusion).  Separation (detachment) of the: ? Retina. ? Vitreous.  Eye inflammation (uveitis).  Eye infection. What increases the risk? You may have a higher risk for floaters if you:  Are older. The risk increases with age.  Are nearsighted.  Have diabetes.  Have had cataracts removed. What are the signs or symptoms? Floaters cause you to see small, shadowy shapes move across your vision. The shapes move as your eyes move. They drift out of your vision when you keep your eyes still. These shapes may look like:  Specks.  Dots.  Circles.  Squiggly lines.  Thread. Sometimes floaters appear along with flashes. Flashes usually occur at the edge of your vision. They may look like:  Bursts of light.  Flashing lights.  Lightning streaks.  What is commonly  referred to as "stars." In some cases, seeing flashes can be a sign of a torn or detached retina, which is a serious condition that requires emergency treatment. How is this diagnosed? Your health care provider may diagnose floaters and flashes based on your symptoms and medical history. An eye care specialist (ophthalmologist) will do an eye exam to determine whether your floaters are a normal part of aging or a warning sign of a more serious eye problem. The specialist may put drops in your eyes to open your pupils wide (dilate) and then use a scope (slit lamp) to look inside your eye. If the specialist is unable to see well enough with a slit lamp, you may need imaging tests such as ultrasound. How is this treated?  If your floaters are the result of aging, you do not need treatment unless they start to affect your vision. Floaters do not go away completely. Most people adapt to the floaters or, over time, the floaters settle below the line of sight.  If a retinal tear or detachment is causing your floaters, you may need surgery.  If you have an underlying condition that is causing floaters, such as an infection, the floaters should go away when that condition is treated.  In rare cases, if the floaters are affecting your vision, surgery to remove the vitreous and replace it with a saltwater solution (vitrectomy) may be considered. Follow these instructions at home:  Take over-the-counter and prescription medicines only as told by your   health care provider.  Do not drive if you have trouble seeing. Ask your health care provider for guidance about when it is and is not safe for you to drive.  Keep all follow-up visits as told by your health care provider. This is important. Contact a health care provider if you:  Have more floaters than usual.  Develop any new symptoms. Get help right away if:  You cannot see well because of your floaters.  You develop flashes along with  floaters.  Your vision suddenly changes, or you lose your vision completely.  It looks as if a curtain is blocking part of your vision. Summary  Eye floaters are spots in your vision caused by specks of material that float around inside your eye.  In most cases, eye floaters are caused by age-related changes in the eye.  Most people do not need treatment for eye floaters unless there is another condition causing the floaters, such as a retinal tear or detachment or an infection. This information is not intended to replace advice given to you by your health care provider. Make sure you discuss any questions you have with your health care provider. Document Revised: 07/30/2018 Document Reviewed: 03/31/2017 Elsevier Patient Education  2020 Elsevier Inc.  

## 2020-01-16 DIAGNOSIS — H43391 Other vitreous opacities, right eye: Secondary | ICD-10-CM | POA: Insufficient documentation

## 2020-01-16 NOTE — Progress Notes (Signed)
Aaron Frey - 59 y.o. male MRN [id number]  Date of birth: [birth year]  Subjective Chief Complaint  Patient presents with   Vision Changes   Headache    HPI Aaron Frey is a 59 y.o. male here today with complaint of vision change.  He reports that he noticed spots floating in vision of R eye starting yesterday, more so this morning.  Describes spots and strings or spider webs floating around his eye.  He doesn't really feel like his vision has changed. He denies eye pain, drainage or fever.  He did have mild headache this morning but this has resolved.  He denies weakness, numbness, tingling, dizziness, or slurred speech.    ROS:  A comprehensive ROS was completed and negative except as noted per HPI  No Known Allergies  Past Medical History:  Diagnosis Date   C2 cervical fracture (HCC) 12/29/2013   C5 vertebral fracture (HCC) 12/29/2013   Class 1 obesity due to excess calories without serious comorbidity with body mass index (BMI) of 32.0 to 32.9 in adult 08/30/2016   Concussion 12/29/2013   Former tobacco use    Hypertension    Hypertriglyceridemia without hypercholesterolemia 09/03/2016   Lumbar transverse process fracture (HCC) 12/29/2013   Migraine    Sacrum and coccyx fracture (HCC) 12/29/2013    Past Surgical History:  Procedure Laterality Date   ACHILLES TENDON REPAIR      Social History   Socioeconomic History   Marital status: Single    Spouse name: Not on file   Number of children: Not on file   Years of education: Not on file   Highest education level: Not on file  Occupational History   Not on file  Tobacco Use   Smoking status: Former Smoker    Packs/day: 0.10    Types: Cigarettes    Quit date: 2015    Years since quitting: 6.7   Smokeless tobacco: Never Used   Tobacco comment: 3-4 cigarettes/day  Substance and Sexual Activity   Alcohol use: Yes    Comment: Occasionally   Drug use: No    Types: Marijuana   Sexual activity: Not  on file  Other Topics Concern   Not on file  Social History Narrative   Not on file   Social Determinants of Health   Financial Resource Strain:    Difficulty of Paying Living Expenses: Not on file  Food Insecurity:    Worried About Running Out of Food in the Last Year: Not on file   [employer] of Food in the Last Year: Not on file  Transportation Needs:    Lack of Transportation (Medical): Not on file   Lack of Transportation (Non-Medical): Not on file  Physical Activity:    Days of Exercise per Week: Not on file   Minutes of Exercise per Session: Not on file  Stress:    Feeling of Stress : Not on file  Social Connections:    Frequency of Communication with Friends and Family: Not on file   Frequency of Social Gatherings with Friends and Family: Not on file   Attends Religious Services: Not on file   Active Member of Clubs or Organizations: Not on file   Attends Banker Meetings: Not on file   Marital Status: Not on file    Family History  Problem Relation Age of Onset   Cancer Mother    Stroke Mother    Dementia Mother    ALS Father     Health  Maintenance  Topic Date Due   COVID-19 Vaccine (1) Never done   HIV Screening  Never done   Fecal DNA (Cologuard)  09/26/2019   INFLUENZA VACCINE  11/21/2019   TETANUS/TDAP  12/30/2023   Hepatitis C Screening  Completed     ----------------------------------------------------------------------------------------------------------------------------------------------------------------------------------------------------------------- Physical Exam BP (!) 159/89 (BP Location: Left Arm, Patient Position: Sitting, Cuff Size: Large)    Pulse 66    Temp 98.6 F (37 C) (Oral)    Wt 238 lb 9.6 oz (108.2 kg)    SpO2 98%    BMI 32.32 kg/m   Physical Exam Constitutional:      Appearance: He is well-developed.  HENT:     Head: Normocephalic and atraumatic.  Eyes:     General: Lids are normal.  Vision grossly intact. No visual field deficit or scleral icterus.       Right eye: No discharge.        Left eye: No discharge.     Extraocular Movements: Extraocular movements intact.     Conjunctiva/sclera:     Right eye: Right conjunctiva is not injected.     Left eye: Left conjunctiva is not injected.     Pupils: Pupils are equal, round, and reactive to light.  Cardiovascular:     Rate and Rhythm: Normal rate and regular rhythm.  Pulmonary:     Effort: Pulmonary effort is normal.     Breath sounds: Normal breath sounds.  Musculoskeletal:     Cervical back: Neck supple.  Neurological:     Mental Status: He is alert.  Psychiatric:        Mood and Affect: Mood normal.     ------------------------------------------------------------------------------------------------------------------------------------------------------------------------------------------------------------------- Assessment and Plan  Vitreous floaters of right eye He has symptoms consistent with vitreous floater.  Normal visual acuity and peripheral vision.  Non-focal neuro exam.  Discussed that this may improve on it's own.  Referral entered to ophthalmology.  Hypertension BP elevated today, start amlodipine.   F/u in 3-4 weeks for BP recheck.    Meds ordered this encounter  Medications   amLODipine (NORVASC) 5 MG tablet    Sig: Take 1 tablet (5 mg total) by mouth daily.    Dispense:  90 tablet    Refill:  3    No follow-ups on file.    This visit occurred during the SARS-CoV-2 public health emergency.  Safety protocols were in place, including screening questions prior to the visit, additional usage of staff PPE, and extensive cleaning of exam room while observing appropriate contact time as indicated for disinfecting solutions.

## 2020-01-16 NOTE — Assessment & Plan Note (Addendum)
He has symptoms consistent with vitreous floater.  Normal visual acuity and peripheral vision.  Non-focal neuro exam.  Discussed that this may improve on it's own.  Referral entered to ophthalmology.

## 2020-01-16 NOTE — Assessment & Plan Note (Signed)
BP elevated today, start amlodipine.   F/u in 3-4 weeks for BP recheck.

## 2020-09-05 ENCOUNTER — Ambulatory Visit (INDEPENDENT_AMBULATORY_CARE_PROVIDER_SITE_OTHER): Payer: 59 | Admitting: Family Medicine

## 2020-09-05 ENCOUNTER — Encounter: Payer: Self-pay | Admitting: Family Medicine

## 2020-09-05 ENCOUNTER — Other Ambulatory Visit: Payer: Self-pay

## 2020-09-05 DIAGNOSIS — G43709 Chronic migraine without aura, not intractable, without status migrainosus: Secondary | ICD-10-CM

## 2020-09-05 DIAGNOSIS — I10 Essential (primary) hypertension: Secondary | ICD-10-CM

## 2020-09-05 MED ORDER — NURTEC 75 MG PO TBDP: ORAL_TABLET | ORAL | 4 refills | Status: DC

## 2020-09-05 MED ORDER — AMLODIPINE BESYLATE 5 MG PO TABS: 5.0000 mg | ORAL_TABLET | Freq: Every day | ORAL | 3 refills | Status: DC

## 2020-09-05 NOTE — Progress Notes (Signed)
Aaron Frey - 60 y.o. male MRN [id number]  Date of birth: 11-Mar-1961  Subjective Chief Complaint  Patient presents with  . Hypertension    HPI Aaron Frey is a 60 y.o. male here today for follow up of HTN and migraines.  He has been prescribed amlodipine for treatment of HTN but he has not been taking regularly.  He reports continued frequent migraines.  He tried topiramate but reports that he felt funny taking this so he discontinued.  He is taking excedrin migraine as needed now but this isn't working well.  He has tried triptans (imitrex) at one point with minor relief.   He denies chest pain, shortness of breath, palpitations, or vision changes.  He does get nausea associated with headache.  ROS:  A comprehensive ROS was completed and negative except as noted per HPI  No Known Allergies  Past Medical History:  Diagnosis Date  . C2 cervical fracture (HCC) 12/29/2013  . C5 vertebral fracture (HCC) 12/29/2013  . Class 1 obesity due to excess calories without serious comorbidity with body mass index (BMI) of 32.0 to 32.9 in adult 08/30/2016  . Concussion 12/29/2013  . Former tobacco use   . Hypertension   . Hypertriglyceridemia without hypercholesterolemia 09/03/2016  . Lumbar transverse process fracture (HCC) 12/29/2013  . Migraine   . Sacrum and coccyx fracture (HCC) 12/29/2013    Past Surgical History:  Procedure Laterality Date  . ACHILLES TENDON REPAIR      Social History   Socioeconomic History  . Marital status: Single    Spouse name: Not on file  . Number of children: Not on file  . Years of education: Not on file  . Highest education level: Not on file  Occupational History  . Not on file  Tobacco Use  . Smoking status: Former Smoker    Packs/day: 0.10    Types: Cigarettes    Quit date: 2015    Years since quitting: 7.3  . Smokeless tobacco: Never Used  . Tobacco comment: 3-4 cigarettes/day  Substance and Sexual Activity  . Alcohol use: Yes    Comment:  Occasionally  . Drug use: No    Types: Marijuana  . Sexual activity: Not on file  Other Topics Concern  . Not on file  Social History Narrative  . Not on file   Social Determinants of Health   Financial Resource Strain: Not on file  Food Insecurity: Not on file  Transportation Needs: Not on file  Physical Activity: Not on file  Stress: Not on file  Social Connections: Not on file    Family History  Problem Relation Age of Onset  . Cancer Mother   . Stroke Mother   . Dementia Mother   . ALS Father     Health Maintenance  Topic Date Due  . COVID-19 Vaccine (1) Never done  . HIV Screening  Never done  . Fecal DNA (Cologuard)  09/26/2019  . INFLUENZA VACCINE  11/20/2020  . TETANUS/TDAP  12/30/2023  . Hepatitis C Screening  Completed  . HPV VACCINES  Aged Out     ----------------------------------------------------------------------------------------------------------------------------------------------------------------------------------------------------------------- Physical Exam BP (!) 153/77 (BP Location: Left Arm, Patient Position: Sitting, Cuff Size: Large)   Pulse 79   Temp 97.8 F (36.6 C)   Ht 6' (1.829 m)   Wt 236 lb 9.6 oz (107.3 kg)   SpO2 98%   BMI 32.09 kg/m   Physical Exam Constitutional:      Appearance: Normal appearance.  HENT:  Head: Normocephalic and atraumatic.  Eyes:     General: No scleral icterus. Cardiovascular:     Rate and Rhythm: Normal rate and regular rhythm.  Pulmonary:     Effort: Pulmonary effort is normal.  Musculoskeletal:     Cervical back: Neck supple.  Neurological:     General: No focal deficit present.     Mental Status: He is alert.  Psychiatric:        Mood and Affect: Mood normal.        Behavior: Behavior normal.      ------------------------------------------------------------------------------------------------------------------------------------------------------------------------------------------------------------------- Assessment and Plan  Migraine Discussed additional options for migraine prevention.  BP control would likely help as well.  He doesn't want to try injectable medications.  Will start nurtec every other day for migraine prevention.    Hypertension BP is not well controlled.  Recommend restarting amlodipine daily.  Low sodium diet recommended.   Return in about 5 weeks (around 10/10/2020) for HTN.    Meds ordered this encounter  Medications  . Rimegepant Sulfate (NURTEC) 75 MG TBDP    Sig: Take every other day for migraine prevention    Dispense:  16 tablet    Refill:  4  . amLODipine (NORVASC) 5 MG tablet    Sig: Take 1 tablet (5 mg total) by mouth daily.    Dispense:  90 tablet    Refill:  3    Return in about 5 weeks (around 10/10/2020) for HTN.    This visit occurred during the SARS-CoV-2 public health emergency.  Safety protocols were in place, including screening questions prior to the visit, additional usage of staff PPE, and extensive cleaning of exam room while observing appropriate contact time as indicated for disinfecting solutions.

## 2020-09-05 NOTE — Assessment & Plan Note (Signed)
BP is not well controlled.  Recommend restarting amlodipine daily.  Low sodium diet recommended.   Return in about 5 weeks (around 10/10/2020) for HTN.

## 2020-09-05 NOTE — Assessment & Plan Note (Signed)
Discussed additional options for migraine prevention.  BP control would likely help as well.  He doesn't want to try injectable medications.  Will start nurtec every other day for migraine prevention.

## 2020-09-05 NOTE — Patient Instructions (Signed)
Restart amlodipine.  Start nurtec and take every other day for migraine prevention.  See me again in 4-6 weeks for blood pressure follow up.  I will complete your paperwork once I receive this.

## 2020-09-06 ENCOUNTER — Telehealth: Payer: Self-pay | Admitting: Family Medicine

## 2020-09-06 NOTE — Telephone Encounter (Signed)
Patient dropped off FMLA paperwork for the provider to fill out. Patient was told about the possible 29$ fee and paperwork turn around time. Billing form attached and placed in provider box. *09/06/20* AM

## 2020-09-07 NOTE — Telephone Encounter (Signed)
Completed and placed in Panya's box 

## 2020-09-08 NOTE — Telephone Encounter (Signed)
Scanned into chart and patient advised that paperwork has been faxed & his copy is up front ready for pick up, placed in accordion up front for pick up. AM

## 2020-09-08 NOTE — Telephone Encounter (Signed)
Faxed to [employer] [phone]) on 09/08/20.  Given to [employer] to [employer].   Please contact patient for document pick-up and advise of faxing.  Thanks

## 2020-10-10 ENCOUNTER — Encounter: Payer: Self-pay | Admitting: Family Medicine

## 2020-10-10 ENCOUNTER — Other Ambulatory Visit: Payer: Self-pay

## 2020-10-10 ENCOUNTER — Ambulatory Visit (INDEPENDENT_AMBULATORY_CARE_PROVIDER_SITE_OTHER): Payer: 59 | Admitting: Family Medicine

## 2020-10-10 VITALS — BP 155/79 | HR 77 | Ht 72.0 in | Wt 226.0 lb

## 2020-10-10 DIAGNOSIS — I10 Essential (primary) hypertension: Secondary | ICD-10-CM

## 2020-10-10 DIAGNOSIS — G43709 Chronic migraine without aura, not intractable, without status migrainosus: Secondary | ICD-10-CM

## 2020-10-10 MED ORDER — AMLODIPINE BESYLATE 10 MG PO TABS: 10.0000 mg | ORAL_TABLET | Freq: Every day | ORAL | 2 refills | Status: DC

## 2020-10-10 MED ORDER — NURTEC 75 MG PO TBDP: ORAL_TABLET | ORAL | 4 refills | Status: DC

## 2020-10-10 NOTE — Assessment & Plan Note (Signed)
BP remains elevated today.  Increasing amlodipine to 10mg  daily. Low sodium diet recommended as well.

## 2020-10-10 NOTE — Assessment & Plan Note (Addendum)
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 We'll see if nurtec is cheaper through manufacturer mail order pharmacy.  Referral placed to neurology.

## 2020-10-10 NOTE — Patient Instructions (Addendum)
Increase amlodipine to 10mg  daily for blood pressure.  Low sodium diet recommended.  I have sent Nurtec to specialty pharmacy [facility] pharmacy) that may be able to get it for you at reduced or no cost.

## 2020-10-10 NOTE — Progress Notes (Signed)
Jaysean Manville - 60 y.o. male MRN [id number]  Date of birth: 1960/10/06  Subjective Chief Complaint  Patient presents with   Hypertension    HPI Milbert Bixler is a 60 y.o. male here today for follow up of HTN and headaches.  He had cut back on amlodipine initially because he was having some flushing feeling.  This has improved over the past couple of days.  BP remains elevated today.  He is checking occasionally at home.  He continues to have headaches.  Nurtec sent in previously and approved by insurance but copay was still around $1000.  He would like referral to neurology due to continued headaches.    ROS:  A comprehensive ROS was completed and negative except as noted per HPI  No Known Allergies  Past Medical History:  Diagnosis Date   C2 cervical fracture (HCC) 12/29/2013   C5 vertebral fracture (HCC) 12/29/2013   Class 1 obesity due to excess calories without serious comorbidity with body mass index (BMI) of 32.0 to 32.9 in adult 08/30/2016   Concussion 12/29/2013   Former tobacco use    Hypertension    Hypertriglyceridemia without hypercholesterolemia 09/03/2016   Lumbar transverse process fracture (HCC) 12/29/2013   Migraine    Sacrum and coccyx fracture (HCC) 12/29/2013    Past Surgical History:  Procedure Laterality Date   ACHILLES TENDON REPAIR      Social History   Socioeconomic History   Marital status: Single    Spouse name: Not on file   Number of children: Not on file   Years of education: Not on file   Highest education level: Not on file  Occupational History   Not on file  Tobacco Use   Smoking status: Former    Packs/day: 0.10    Pack years: 0.00    Types: Cigarettes    Quit date: 2015    Years since quitting: 7.4   Smokeless tobacco: Never   Tobacco comments:    3-4 cigarettes/day  Substance and Sexual Activity   Alcohol use: Yes    Comment: Occasionally   Drug use: No    Types: Marijuana   Sexual activity: Not on file  Other Topics Concern    Not on file  Social History Narrative   Not on file   Social Determinants of Health   Financial Resource Strain: Not on file  Food Insecurity: Not on file  Transportation Needs: Not on file  Physical Activity: Not on file  Stress: Not on file  Social Connections: Not on file    Family History  Problem Relation Age of Onset   Cancer Mother    Stroke Mother    Dementia Mother    ALS Father     Health Maintenance  Topic Date Due   COVID-19 Vaccine (1) Never done   HIV Screening  Never done   Zoster Vaccines- Shingrix (2 of 2) 12/02/2016   Fecal DNA (Cologuard)  09/26/2019   INFLUENZA VACCINE  11/20/2020   TETANUS/TDAP  12/30/2023   Hepatitis C Screening  Completed   Pneumococcal Vaccine 74-19 Years old  Aged Out   HPV VACCINES  Aged Out     ----------------------------------------------------------------------------------------------------------------------------------------------------------------------------------------------------------------- Physical Exam BP (!) 155/79 (BP Location: Left Arm, Patient Position: Sitting, Cuff Size: Large)   Pulse 77   Ht 6' (1.829 m)   Wt 226 lb (102.5 kg)   SpO2 98%   BMI 30.65 kg/m   Physical Exam Constitutional:      Appearance: Normal appearance.  Eyes:     General: No scleral icterus. Cardiovascular:     Rate and Rhythm: Normal rate and regular rhythm.  Pulmonary:     Effort: Pulmonary effort is normal.     Breath sounds: Normal breath sounds.  Musculoskeletal:     Cervical back: Neck supple.  Neurological:     General: No focal deficit present.     Mental Status: He is alert.  Psychiatric:        Mood and Affect: Mood normal.        Behavior: Behavior normal.    ------------------------------------------------------------------------------------------------------------------------------------------------------------------------------------------------------------------- Assessment and Plan  Hypertension BP  remains elevated today.  Increasing amlodipine to 10mg  daily. Low sodium diet recommended as well.   Migraine Components of both cervicogenic headaches and migraines. We'll see if nurtec is cheaper through manufacturer mail order pharmacy.  Referral placed to neurology.    Meds ordered this encounter  Medications   Rimegepant Sulfate (NURTEC) 75 MG TBDP    Sig: Take every other day for migraine prevention    Dispense:  16 tablet    Refill:  4   amLODipine (NORVASC) 10 MG tablet    Sig: Take 1 tablet (10 mg total) by mouth daily.    Dispense:  90 tablet    Refill:  2    Return in about 2 months (around 12/10/2020) for HTN.    This .visit occurred during the SARS-CoV-2 public health emergency.  Safety protocols were in place, including screening questions prior to the visit, additional usage of staff PPE, and extensive cleaning of exam room while observing appropriate contact time as indicated for disinfecting solutions.

## 2020-12-18 ENCOUNTER — Ambulatory Visit (INDEPENDENT_AMBULATORY_CARE_PROVIDER_SITE_OTHER): Payer: 59 | Admitting: Family Medicine

## 2020-12-18 ENCOUNTER — Encounter: Payer: Self-pay | Admitting: Family Medicine

## 2020-12-18 ENCOUNTER — Other Ambulatory Visit: Payer: Self-pay

## 2020-12-18 DIAGNOSIS — M79671 Pain in right foot: Secondary | ICD-10-CM | POA: Insufficient documentation

## 2020-12-18 DIAGNOSIS — I10 Essential (primary) hypertension: Secondary | ICD-10-CM

## 2020-12-18 MED ORDER — GABAPENTIN 300 MG PO CAPS: 300.0000 mg | ORAL_CAPSULE | Freq: Every evening | ORAL | 3 refills | Status: DC | PRN

## 2020-12-18 NOTE — Assessment & Plan Note (Signed)
Blood pressure is mildly elevated in the office however readings at home are acceptable.  Continue amlodipine at current strength.  Low-sodium diet stressed.  Follow-up in approximately 6 months.

## 2020-12-18 NOTE — Assessment & Plan Note (Signed)
Symptoms sound neuropathic in nature.  Trial of gabapentin 300 mg at bedtime provided.

## 2020-12-18 NOTE — Patient Instructions (Signed)
Try gabapentin for foot pain.  See Dr. Benjamin Stain for groin pain.  Continue amlodipine and nurtec.

## 2020-12-18 NOTE — Progress Notes (Signed)
Aaron Frey - 60 y.o. male MRN [id number]  Date of birth: May 19, 1960  Subjective Chief Complaint  Patient presents with   Hypertension    HPI Aaron Frey is a 60 year old male here today for follow-up of hypertension.  Hypertension is currently treated with amlodipine 10 mg daily.  He states he is doing well with this.  Blood pressures at home are between 130-140/80.  He denies any side effects from medication.  He denies chest pain, shortness of breath, palpitations, headache or vision changes.    Is also having increased pain in his feet with burning sensation.  This mainly affects the right foot.  He has had surgery on this foot previously and he has had intermittent symptoms since that time.  He denies any swelling of the foot or ankle.  ROS:  A comprehensive ROS was completed and negative except as noted per HPI  No Known Allergies  Past Medical History:  Diagnosis Date   C2 cervical fracture (HCC) 12/29/2013   C5 vertebral fracture (HCC) 12/29/2013   Class 1 obesity due to excess calories without serious comorbidity with body mass index (BMI) of 32.0 to 32.9 in adult 08/30/2016   Concussion 12/29/2013   Former tobacco use    Hypertension    Hypertriglyceridemia without hypercholesterolemia 09/03/2016   Lumbar transverse process fracture (HCC) 12/29/2013   Migraine    Sacrum and coccyx fracture (HCC) 12/29/2013    Past Surgical History:  Procedure Laterality Date   ACHILLES TENDON REPAIR      Social History   Socioeconomic History   Marital status: Single    Spouse name: Not on file   Number of children: Not on file   Years of education: Not on file   Highest education level: Not on file  Occupational History   Not on file  Tobacco Use   Smoking status: Former    Packs/day: 0.10    Types: Cigarettes    Quit date: 2015    Years since quitting: 7.6   Smokeless tobacco: Never   Tobacco comments:    3-4 cigarettes/day  Substance and Sexual Activity   Alcohol use: Yes     Comment: Occasionally   Drug use: No    Types: Marijuana   Sexual activity: Not on file  Other Topics Concern   Not on file  Social History Narrative   Not on file   Social Determinants of Health   Financial Resource Strain: Not on file  Food Insecurity: Not on file  Transportation Needs: Not on file  Physical Activity: Not on file  Stress: Not on file  Social Connections: Not on file    Family History  Problem Relation Age of Onset   Cancer Mother    Stroke Mother    Dementia Mother    ALS Father     Health Maintenance  Topic Date Due   COVID-19 Vaccine (1) Never done   HIV Screening  Never done   Zoster Vaccines- Shingrix (2 of 2) 12/02/2016   Fecal DNA (Cologuard)  09/26/2019   INFLUENZA VACCINE  11/20/2020   TETANUS/TDAP  12/30/2023   Hepatitis C Screening  Completed   Pneumococcal Vaccine 57-86 Years old  Aged Out   HPV VACCINES  Aged Out     ----------------------------------------------------------------------------------------------------------------------------------------------------------------------------------------------------------------- Physical Exam BP (!) 148/80 (BP Location: Left Arm, Patient Position: Sitting, Cuff Size: Large)   Pulse 80   Temp 98.3 F (36.8 C)   Ht 6' (1.829 m)   Wt 228 lb (103.4 kg)  SpO2 97%   BMI 30.92 kg/m   Physical Exam Constitutional:      Appearance: Normal appearance.  Eyes:     General: No scleral icterus. Cardiovascular:     Rate and Rhythm: Normal rate and regular rhythm.  Pulmonary:     Effort: Pulmonary effort is normal.     Breath sounds: Normal breath sounds.  Musculoskeletal:     Cervical back: Neck supple.     Comments: Dorsalis pedis and posterior tibial pulses are 2+ bilaterally.  Neurological:     General: No focal deficit present.     Mental Status: He is alert.  Psychiatric:        Mood and Affect: Mood normal.        Behavior: Behavior normal.     ------------------------------------------------------------------------------------------------------------------------------------------------------------------------------------------------------------------- Assessment and Plan  Hypertension Blood pressure is mildly elevated in the office however readings at home are acceptable.  Continue amlodipine at current strength.  Low-sodium diet stressed.  Follow-up in approximately 6 months.  Right foot pain Symptoms sound neuropathic in nature.  Trial of gabapentin 300 mg at bedtime provided.   Meds ordered this encounter  Medications   gabapentin (NEURONTIN) 300 MG capsule    Sig: Take 1 capsule (300 mg total) by mouth at bedtime as needed.    Dispense:  90 capsule    Refill:  3    Return in about 6 months (around 06/19/2021) for HTN.    This visit occurred during the SARS-CoV-2 public health emergency.  Safety protocols were in place, including screening questions prior to the visit, additional usage of staff PPE, and extensive cleaning of exam room while observing appropriate contact time as indicated for disinfecting solutions.

## 2020-12-26 ENCOUNTER — Ambulatory Visit (INDEPENDENT_AMBULATORY_CARE_PROVIDER_SITE_OTHER): Payer: 59

## 2020-12-26 ENCOUNTER — Ambulatory Visit (INDEPENDENT_AMBULATORY_CARE_PROVIDER_SITE_OTHER): Payer: 59 | Admitting: Sports Medicine

## 2020-12-26 ENCOUNTER — Other Ambulatory Visit: Payer: Self-pay

## 2020-12-26 DIAGNOSIS — M1611 Unilateral primary osteoarthritis, right hip: Secondary | ICD-10-CM

## 2020-12-26 MED ORDER — NAPROXEN 500 MG PO TABS
500.0000 mg | ORAL_TABLET | Freq: Two times a day (BID) | ORAL | 3 refills | Status: DC
Start: 2020-12-26 — End: 2021-04-30

## 2020-12-26 NOTE — Assessment & Plan Note (Signed)
This is a pleasant 60 year old male with a history of hip osteoarthritis on x-rays from 2020. He was playing basketball and more recently started to have increasing pain right groin. On exam he has reproduction of pain with internal rotation of the right hip, he has maybe 5 degrees of internal rotation at the most. Switching from over-the-counter to prescription strength naproxen, adding updated x-rays, hip arthritis conditioning. Return to see me in couple weeks, injection if no better.

## 2020-12-26 NOTE — Progress Notes (Signed)
    Procedures performed today:    None.  Independent interpretation of notes and tests performed by another provider:   None.  Brief History, Exam, Impression, and Recommendations:    Primary osteoarthritis of right hip This is a pleasant 60 year old male with a history of hip osteoarthritis on x-rays from 2020. He was playing basketball and more recently started to have increasing pain right groin. On exam he has reproduction of pain with internal rotation of the right hip, he has maybe 5 degrees of internal rotation at the most. Switching from over-the-counter to prescription strength naproxen, adding updated x-rays, hip arthritis conditioning. Return to see me in couple weeks, injection if no better.  Chronic process with exacerbation and pharmacologic intervention   ___________________________________________ Ihor Austin. Benjamin Stain, M.D., ABFM., CAQSM. Primary Care and Sports Medicine [facility]  Adjunct Instructor of Family Medicine  [employer]

## 2021-01-10 ENCOUNTER — Ambulatory Visit (INDEPENDENT_AMBULATORY_CARE_PROVIDER_SITE_OTHER): Payer: 59 | Admitting: Neurology

## 2021-01-10 ENCOUNTER — Encounter: Payer: Self-pay | Admitting: Neurology

## 2021-01-10 ENCOUNTER — Other Ambulatory Visit: Payer: Self-pay

## 2021-01-10 VITALS — BP 130/80 | HR 77 | Ht 73.0 in | Wt 223.0 lb

## 2021-01-10 DIAGNOSIS — G43709 Chronic migraine without aura, not intractable, without status migrainosus: Secondary | ICD-10-CM | POA: Diagnosis not present

## 2021-01-10 MED ORDER — EMGALITY 120 MG/ML ~~LOC~~ SOAJ: 120.0000 mg | SUBCUTANEOUS | 11 refills | Status: DC

## 2021-01-10 MED ORDER — UBRELVY 100 MG PO TABS: 100.0000 mg | ORAL_TABLET | ORAL | 11 refills | Status: DC | PRN

## 2021-01-10 MED ORDER — EMGALITY 120 MG/ML ~~LOC~~ SOAJ: SUBCUTANEOUS | 0 refills | Status: DC

## 2021-01-10 NOTE — Progress Notes (Signed)
Patient instructed on Emgality injections.  Emgality 120mg /mL, 1 given right upper arm subcutaneously,second one given right thigh subcutaneous.  He tolerated well Lot H expiration 08/24/2018 2 pack. 10/24/2018  He was given another pack of 2 to put in his refrigerator at home to use 1 injection every 30 days.  Instructed on sites.  He verbalized understanding tolerated injections .no Band-Aid applied. no bleeding.  He will call back with questions. he was given Marland Kitchen co-pay savings card instructed instructed on how to use that.
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
## 2021-01-10 NOTE — Patient Instructions (Addendum)
Emergency management: Excedrin up to 2 days a week. Aaron Frey: Please take one tablet at the onset of your headache. If it does not improve the symptoms please take one additional tablet. Do not take more then 2 tablets in 24hrs. Do not take use more then 2 to 3 times in a week.  Prevention: Emgality monthly  Ubrogepant tablets What is this medication? UBROGEPANT (ue BROE je pant) is used to treat migraine headaches with or without aura. An aura is a strange feeling or visual disturbance that warns you of an attack. It is not used to prevent migraines. This medicine may be used for other purposes; ask your health care provider or pharmacist if you have questions. COMMON BRAND NAME(S): Aaron Frey What should I tell my care team before I take this medication? They need to know if you have any of these conditions: kidney disease liver disease an unusual or allergic reaction to ubrogepant, other medicines, foods, dyes, or preservatives pregnant or trying to get pregnant breast-feeding How should I use this medication? Take this medicine by mouth with a glass of water. Follow the directions on the prescription label. You can take it with or without food. If it upsets your stomach, take it with food. Take your medicine at regular intervals. Do not take it more often than directed. Do not stop taking except on your doctor's advice. Talk to your pediatrician about the use of this medicine in children. Special care may be needed. Overdosage: If you think you have taken too much of this medicine contact a poison control center or emergency room at once. NOTE: This medicine is only for you. Do not share this medicine with others. What if I miss a dose? This does not apply. This medicine is not for regular use. What may interact with this medication? Do not take this medicine with any of the following medicines: ceritinib certain antibiotics like chloramphenicol, clarithromycin, telithromycin certain  antivirals for HIV like atazanavir, cobicistat, darunavir, delavirdine, fosamprenavir, indinavir, ritonavir certain medicines for fungal infections like itraconazole, ketoconazole, posaconazole, voriconazole conivaptan grapefruit idelalisib mifepristone nefazodone ribociclib This medicine may also interact with the following medications: carvedilol certain medicines for seizures like phenobarbital, phenytoin ciprofloxacin cyclosporine eltrombopag fluconazole fluvoxamine quinidine rifampin St. John's wort verapamil This list may not describe all possible interactions. Give your health care provider a list of all the medicines, herbs, non-prescription drugs, or dietary supplements you use. Also tell them if you smoke, drink alcohol, or use illegal drugs. Some items may interact with your medicine. What should I watch for while using this medication? Visit your health care professional for regular checks on your progress. Tell your health care professional if your symptoms do not start to get better or if they get worse. Your mouth may get dry. Chewing sugarless gum or sucking hard candy and drinking plenty of water may help. Contact your health care professional if the problem does not go away or is severe. What side effects may I notice from receiving this medication? Side effects that you should report to your doctor or health care professional as soon as possible: allergic reactions like skin rash, itching or hives; swelling of the face, lips, or tongue Side effects that usually do not require medical attention (report these to your doctor or health care professional if they continue or are bothersome): drowsiness dry mouth nausea tiredness This list may not describe all possible side effects. Call your doctor for medical advice about side effects. You may report side effects to  FDA at 1-800-FDA-1088. Where should I keep my medication? Keep out of the reach of children. Store at  room temperature between 15 and 30 degrees C (59 and 86 degrees F). Throw away any unused medicine after the expiration date. NOTE: This sheet is a summary. It may not cover all possible information. If you have questions about this medicine, talk to your doctor, pharmacist, or health care provider.  2022 Elsevier/Gold Standard (2018-06-25 08:50:55)   Analgesic Rebound Headache An analgesic rebound headache, sometimes called a medication overuse headache or a drug-induced headache, is a secondary disorder that is caused by the overuse of pain medicine (analgesic) to treat the original (primary) headache. Any type of primary headache can return as a rebound headache if a person regularly takes analgesics. The types of primary headaches that are commonly associated with rebound headaches include: Migraines. Headaches that are caused by tense muscles in the head and neck area (tension headaches). Headaches that develop and happen again on one side of the head and around the eye (cluster headaches). If rebound headaches continue, they can become long-term, daily headaches. What are the causes? This condition may be caused by frequent use of: Over-the-counter medicines such as aspirin, ibuprofen, and acetaminophen. Sinus-relief medicines and medicines that contain caffeine. Narcotic pain medicines such as codeine and oxycodone. Some prescription migraine medicines. What are the signs or symptoms? The symptoms of a rebound headache are the same as the symptoms of the original headache. Some of the symptoms of specific types of headaches include: Migraine headache Pulsing or throbbing pain on one or both sides of the head. Severe pain that interferes with daily activities. Pain that gets worse with physical activity. Nausea, vomiting, or both. Pain and sensitivity with exposure to bright light, loud noises, or strong smells. Visual changes. Numbness of one or both arms. Tension  headache Pressure around the head. Dull, aching head pain. Pain felt over the front and sides of the head. Tenderness in the muscles of the head, neck, and shoulders. Cluster headache Severe pain that begins in or around one eye or temple. Droopy or swollen eyelid, or redness and tearing in the eye on the same side as the pain. One-sided head pain. Nausea. Runny nose. Sweaty, pale facial skin. Restlessness. How is this diagnosed? This condition is diagnosed by: Reviewing your medical history. This includes the nature of your primary headaches. Reviewing the types of pain medicines that you have been using to treat your primary headaches and how often you take them. How is this treated? This condition may be treated or managed by: Discontinuing frequent use of the analgesic medicine. Doing this may worsen your headaches at first, but the pain should eventually become more manageable, less frequent, and less severe. Seeing a headache specialist. He or she may be able to help you manage your headaches and help make sure there is not another cause of the headaches. Using methods of stress relief, such as acupuncture, counseling, biofeedback, and massage. Talk with your health care provider about which methods might be good for you. Follow these instructions at home: Medicines  Take over-the-counter and prescription medicines only as told by your health care provider. Stop the repeated use of pain medicine as told by your health care provider. Stopping can be difficult. Carefully follow instructions from your health care provider. Lifestyle  Follow a regular sleep schedule. Do not vary the time that you go to bed or the amount that you sleep from day to day. It is important  to stay on the same schedule to help prevent headaches. Get 7-9 hours of sleep each night, or the amount recommended by your health care provider. Exercise regularly. Exercise for at least 30 minutes, 5 times each  week. Limit or manage stress. Consider stress-relief options such as acupuncture, counseling, biofeedback, and massage. Talk with your health care provider about which methods might be good for you. Do not drink alcohol. Do not use any products that contain nicotine or tobacco, such as cigarettes, e-cigarettes, and chewing tobacco. If you need help quitting, ask your health care provider. General instructions Avoid triggers that are known to cause your primary headaches. Keep all follow-up visits as told by your health care provider. This is important. Contact a health care provider if: You continue to experience headaches after following treatments that your health care provider recommended. Get help right away if you have: New headache pain. Headache pain that is different than what you have experienced in the past. Numbness or tingling in your arms or legs. Changes in your speech or vision. Summary An analgesic rebound headache, sometimes called a medication overuse headache or a drug-induced headache, is caused by the overuse of pain medicine (analgesic) to treat the original (primary) headache. Any type of primary headache can return as a rebound headache if a person regularly takes analgesics. The types of primary headaches that are commonly associated with rebound headaches include migraines, tension headaches, and cluster headaches. Analgesic rebound headaches can occur with frequent use of over-the-counter medicines and prescription medicines. Treatment involves stopping the medicine that is being overused. This will improve headache frequency and severity. This information is not intended to replace advice given to you by your health care provider. Make sure you discuss any questions you have with your health care provider. Document Revised: 05/06/2019 Document Reviewed: 05/06/2019 Elsevier Patient Education  2022 [employer].

## 2021-01-11 ENCOUNTER — Telehealth: Payer: Self-pay | Admitting: *Deleted

## 2021-01-11 NOTE — Telephone Encounter (Signed)
[name] Key: [id number] - PA Case ID: [id number] - Rx #: [id number]  Sent PA for [name] waiting on approval

## 2021-01-12 ENCOUNTER — Other Ambulatory Visit: Payer: Self-pay

## 2021-01-12 ENCOUNTER — Ambulatory Visit (INDEPENDENT_AMBULATORY_CARE_PROVIDER_SITE_OTHER): Payer: 59 | Admitting: Sports Medicine

## 2021-01-12 DIAGNOSIS — M1611 Unilateral primary osteoarthritis, right hip: Secondary | ICD-10-CM | POA: Diagnosis not present

## 2021-01-12 NOTE — Assessment & Plan Note (Signed)
Aaron Frey returns, he is a pleasant 60 year old male, x-ray confirmed severe osteoarthritis right hip, much better with naproxen. He has really not done his conditioning exercises, still has a bit of discomfort in the lateral hip, no tenderness at the greater trochanter, I think this is still coming from his hip joint, he agrees to get more diligent with his home conditioning exercises and return to see me as needed for an injection.

## 2021-01-12 NOTE — Progress Notes (Signed)
    Procedures performed today:    None.  Independent interpretation of notes and tests performed by another provider:   None.  Brief History, Exam, Impression, and Recommendations:    Primary osteoarthritis of right hip Aaron Frey returns, he is a pleasant 60 year old male, x-ray confirmed severe osteoarthritis right hip, much better with naproxen. He has really not done his conditioning exercises, still has a bit of discomfort in the lateral hip, no tenderness at the greater trochanter, I think this is still coming from his hip joint, he agrees to get more diligent with his home conditioning exercises and return to see me as needed for an injection.    ___________________________________________ Aaron Frey. Aaron Frey, M.D., ABFM., CAQSM. Primary Care and Sports Medicine [facility]  Adjunct Instructor of Family Medicine  [employer]

## 2021-01-15 NOTE — Telephone Encounter (Signed)
Ubrelvy denied.  Appeals: Phone: [phone] Fax: [phone]  Per your health plan's criteria, this drug is covered if you meet the following: If you have four or more headache days per month, you must meet one of the following: (A) You are currently being treated with Elavil (amitriptyline) or Effexor (venlafaxine) unless you cannot take these drugs. (B) You are currently being treated with Depakote/Depakote ER (divalproex sodium) or Topamax (topiramate) unless you cannot take these drugs. (C) You are currently being treated with a beta blocker (that is, atenolol, propranolol, nadolol, timolol, or metoprolol) unless you cannot take these drugs. (D) You are currently being treated with candesartan unless you cannot take this drug. The information provided does not show that you meet the criteria listed above. Please speak with your doctor about your choices. Reviewed by: Mercy Riding

## 2021-01-16 ENCOUNTER — Telehealth: Payer: Self-pay

## 2021-01-16 MED ORDER — CANDESARTAN CILEXETIL 4 MG PO TABS: 4.0000 mg | ORAL_TABLET | Freq: Every day | ORAL | 2 refills | Status: DC

## 2021-01-16 NOTE — Telephone Encounter (Signed)
Emgality denied.  Per your health plan's criteria, this drug is covered if you meet the following: (1) There are paid claims or your doctor provides medical records (for example: chart notes) showing one of the following: (A) You have tried amitriptyline or venlafaxine for at least two months or cannot use the drugs. (B) You have tried one beta blocker drug (that is, atenolol, propranolol, nadolol, timolol, or metoprolol) for at least two months or cannot use the drugs. (C) You have tried candesartan for at least two months or cannot use the drug. (2) There are paid claims or your doctor provides medical records (for example: chart notes) showing that you have tried or cannot use both Aimovig and Ajovy. The information provided does not show that you meet the criteria listed above. Please speak with your doctor about your choices. Reviewed by: Johnnette Litter.Ph. **Please note: The drug(s) listed above may require additional review. Case number: [id number]

## 2021-01-16 NOTE — Telephone Encounter (Signed)
I spoke with the patient.  He thinks he may have already filled the [place] but he is aware that he should be eligible to use a savings card to continue filling this even though insurance is not approving it.  I let him know we are waiting on insurance approval for the [facility] as well.  He currently has some samples at home to use and he understands 1 syringe is to be administered every 30 days.  His questions were answered.  He verbalized appreciation for the call.

## 2021-01-16 NOTE — Telephone Encounter (Signed)
Per Dr Lucia Gaskins, prescribed Candartan 4 mg PO QD and notify patient. I have sent pt a mychart message and sent the prescription to the pharmacy.

## 2021-01-16 NOTE — Addendum Note (Signed)
Addended by: Bertram Savin on: 01/16/2021 01:51 PM   Modules accepted: Orders

## 2021-01-16 NOTE — Telephone Encounter (Signed)
I have submitted a PA request for Emgality on [facility], Key: [id number] - PA Case ID: [id number].   Awaiting determination from [facility].

## 2021-01-23 ENCOUNTER — Ambulatory Visit (INDEPENDENT_AMBULATORY_CARE_PROVIDER_SITE_OTHER): Payer: 59

## 2021-01-23 ENCOUNTER — Ambulatory Visit (INDEPENDENT_AMBULATORY_CARE_PROVIDER_SITE_OTHER): Payer: 59 | Admitting: Sports Medicine

## 2021-01-23 DIAGNOSIS — M1611 Unilateral primary osteoarthritis, right hip: Secondary | ICD-10-CM | POA: Diagnosis not present

## 2021-01-23 DIAGNOSIS — Z23 Encounter for immunization: Secondary | ICD-10-CM | POA: Diagnosis not present

## 2021-01-23 NOTE — Progress Notes (Signed)
    Procedures performed today:    Procedure: Real-time Ultrasound Guided injection of the right hip Device: Samsung HS60  Verbal informed consent obtained.  Time-out conducted.  Noted no overlying erythema, induration, or other signs of local infection.  Skin prepped in a sterile fashion.  Local anesthesia: Topical Ethyl chloride.  With sterile technique and under real time ultrasound guidance: Noted arthritic joint, 1 cc Kenalog 40, 2 cc lidocaine, 2 cc bupivacaine injected easily Completed without difficulty  Advised to call if fevers/chills, erythema, induration, drainage, or persistent bleeding.  Images permanently stored and available for review in PACS.  Impression: Technically successful ultrasound guided injection.  Independent interpretation of notes and tests performed by another provider:   None.  Brief History, Exam, Impression, and Recommendations:    Primary osteoarthritis of right hip Aleczander returns, pleasant 60 year old male, x-ray confirmed severe right hip osteoarthritis, initially better with naproxen, he had not done any of his conditioning exercises, unfortunately he is now starting to have recurrence of pain, localized in the groin, requesting injection. We injected his hip joint today, we discussed hip arthroplasty, return to see me in 4 weeks as needed.    ___________________________________________ Ihor Austin. Benjamin Stain, M.D., ABFM., CAQSM. Primary Care and Sports Medicine [facility]  Adjunct Instructor of Family Medicine  [employer]

## 2021-01-23 NOTE — Assessment & Plan Note (Signed)
Aaron Frey returns, pleasant 60 year old male, x-ray confirmed severe right hip osteoarthritis, initially better with naproxen, he had not done any of his conditioning exercises, unfortunately he is now starting to have recurrence of pain, localized in the groin, requesting injection. We injected his hip joint today, we discussed hip arthroplasty, return to see me in 4 weeks as needed.

## 2021-02-19 ENCOUNTER — Telehealth: Payer: Self-pay

## 2021-02-19 NOTE — Telephone Encounter (Signed)
Medication: NURTEC 75 MG TBDP Prior authorization submitted via CoverMyMeds on 02/19/2021 PA submission pending

## 2021-03-05 ENCOUNTER — Telehealth: Payer: Self-pay | Admitting: Neurology

## 2021-03-05 NOTE — Telephone Encounter (Signed)
Pt requesting refill for Galcanezumab-gnlm (EMGALITY) 120 MG/ML SOAJ and NURTEC 75 MG TBDP. Pharmacy [facility] [phone] - [place], [place] - [address] Also pt is asking for med discount card that he has not received pt requesting a call back.

## 2021-03-05 NOTE — Telephone Encounter (Signed)
Pt is to be trying Candesartan as this is needed first before insurance will cover Emgality.

## 2021-03-06 NOTE — Telephone Encounter (Signed)
I called pt LMVM for him that Emgality is on hold as insurance wanted him to try candesartan first (last prescribed 01-16-21) at [facility]. Call back if questions.  Emgality is on hold right now until he tries this.

## 2021-03-06 NOTE — Telephone Encounter (Signed)
Pt returned call.  He stated the nurtec does not help. Bernita Raisin did but is not covered by his insurance.  He is taking the candesartan 4mg  po daily for chronic migraines.  Emgality he took yesterday.  He has appt in January 2023 with MM.NP.   I relayed that meds are a trial and error, to find what works for him.  He will keep on the candesartan. Emgality and [place] denied.  He will call [place] back if does not tolerate candesartan.  I will check to see if he can get another acute med since nurtec not helping.

## 2021-03-07 MED ORDER — UBRELVY 100 MG PO TABS: 100.0000 mg | ORAL_TABLET | ORAL | 11 refills | Status: DC | PRN

## 2021-03-07 NOTE — Telephone Encounter (Signed)
I did send in new ubrelvy prescription to myscripts (with tried and failed including nurtec and candesartan).  Hopefully will get approved.

## 2021-03-07 NOTE — Addendum Note (Signed)
Addended by: Guy Begin on: 03/07/2021 10:10 AM   Modules accepted: Orders

## 2021-03-12 ENCOUNTER — Telehealth: Payer: Self-pay | Admitting: Neurology

## 2021-03-12 NOTE — Telephone Encounter (Signed)
Pt has not tolerated the candesartan and prescription for Ubrelvy faxed to [employer].  Hopefully this will get approved.

## 2021-03-12 NOTE — Telephone Encounter (Signed)
Pt called, Friday started having a reaction to candesartan (ATACAND) 4 MG tablet. Having tingling on skin,which was really bad on Sunday. Would like a call from the nurse.

## 2021-03-12 NOTE — Telephone Encounter (Signed)
I spoke to pt and relayed that stopped candesartan (Saturday) he is better now, (had eye bubble that was painful this am) not sure this was from medication.  Sent in [place] to myscripts ( and will retry to get emgality approved).  He appreciated call back.

## 2021-03-17 IMAGING — DX LUMBAR SPINE - COMPLETE 4+ VIEW
5 series · 5 of 5 positions shown · non-contrast
Comparison: 12/29/2013

CLINICAL DATA: Low back pain, history of transverse process
fracture

EXAM:
LUMBAR SPINE - COMPLETE 4+ VIEW

[l-spine ap]
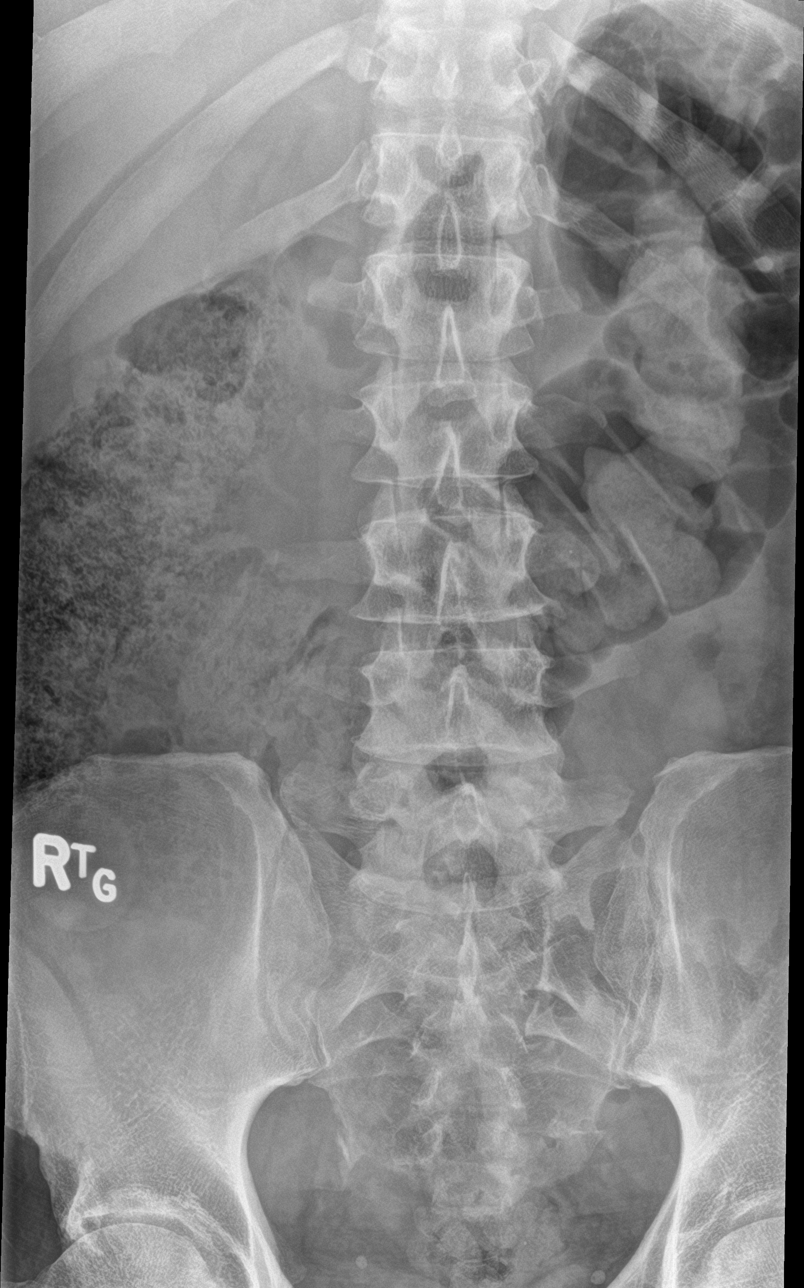

[l-spine obl (1 of 2)]
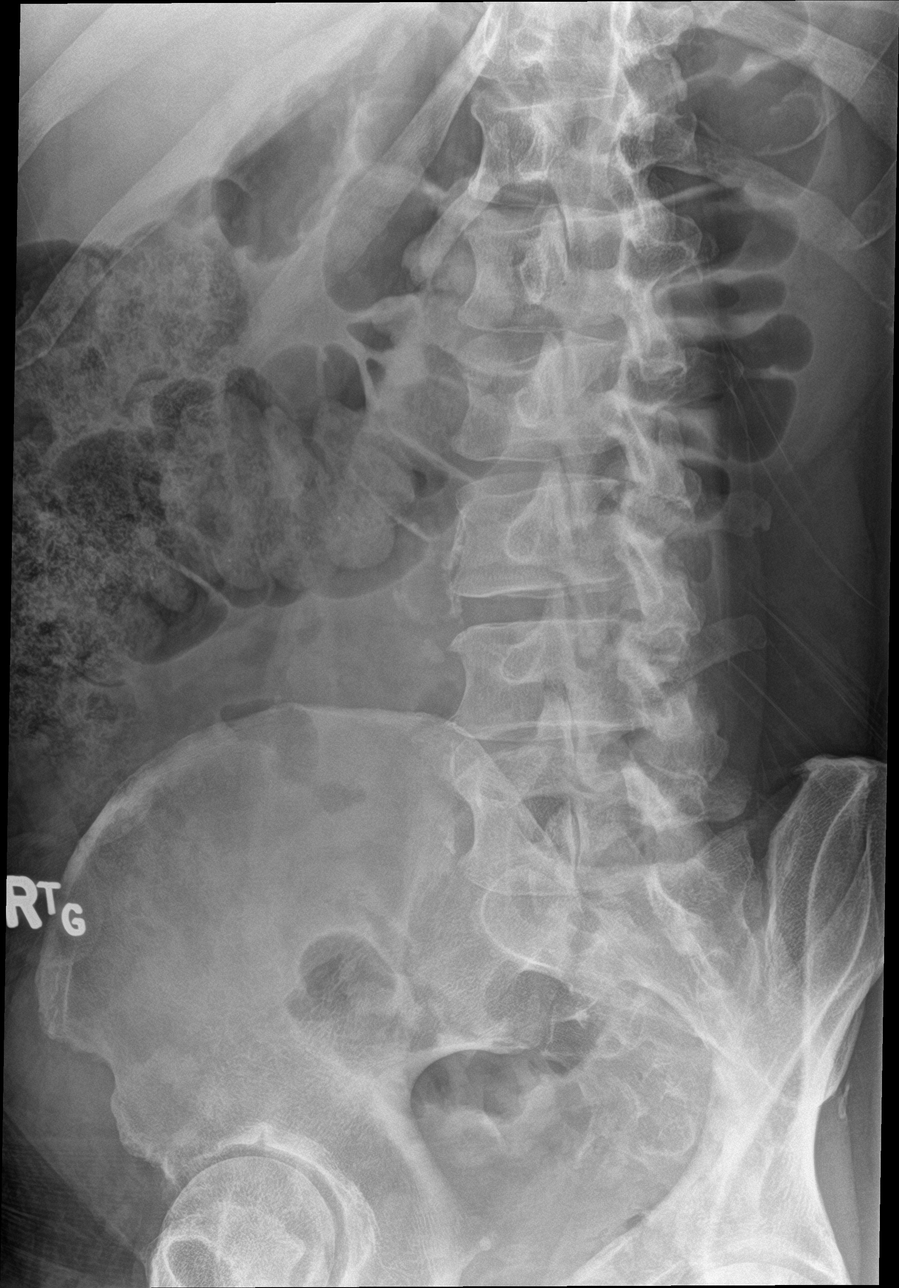

[l-spine obl (2 of 2)]
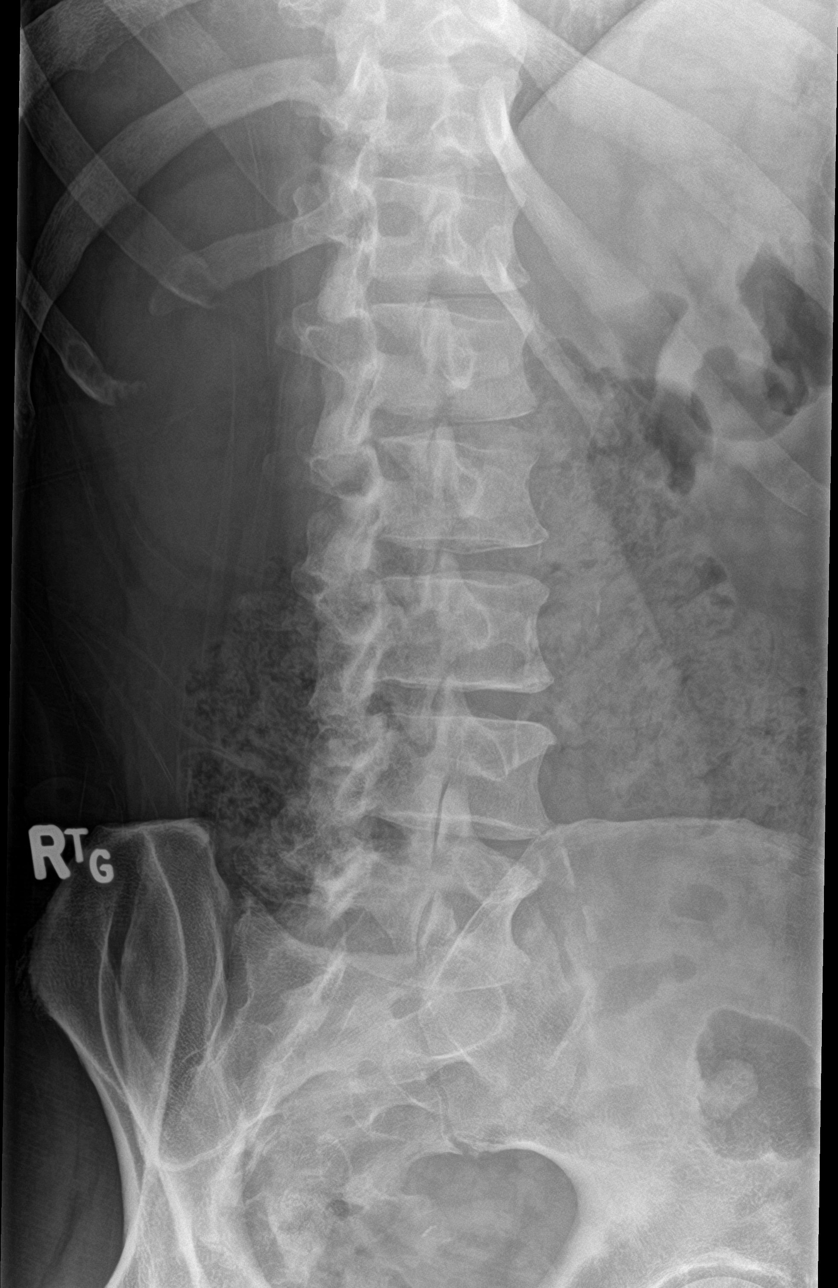

[l-spine lat]
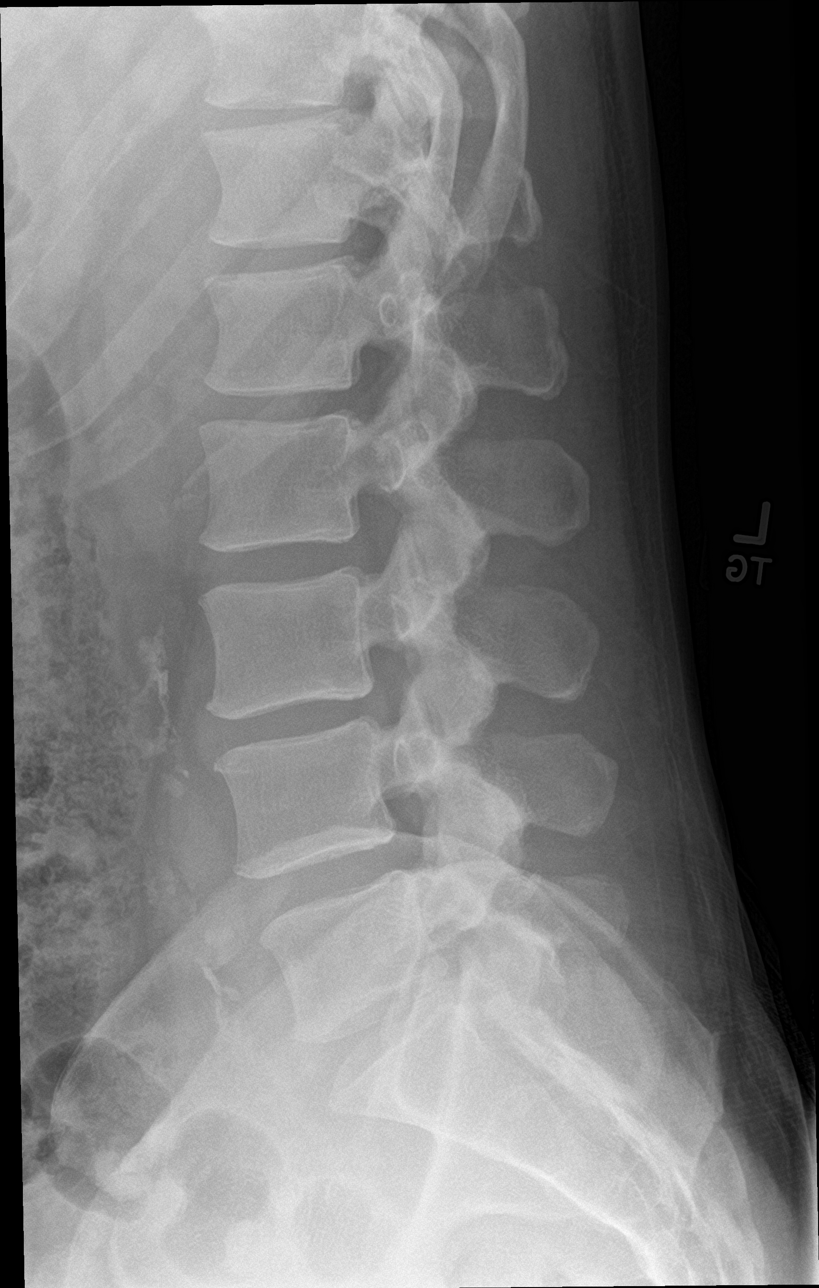

[l-spine spot]
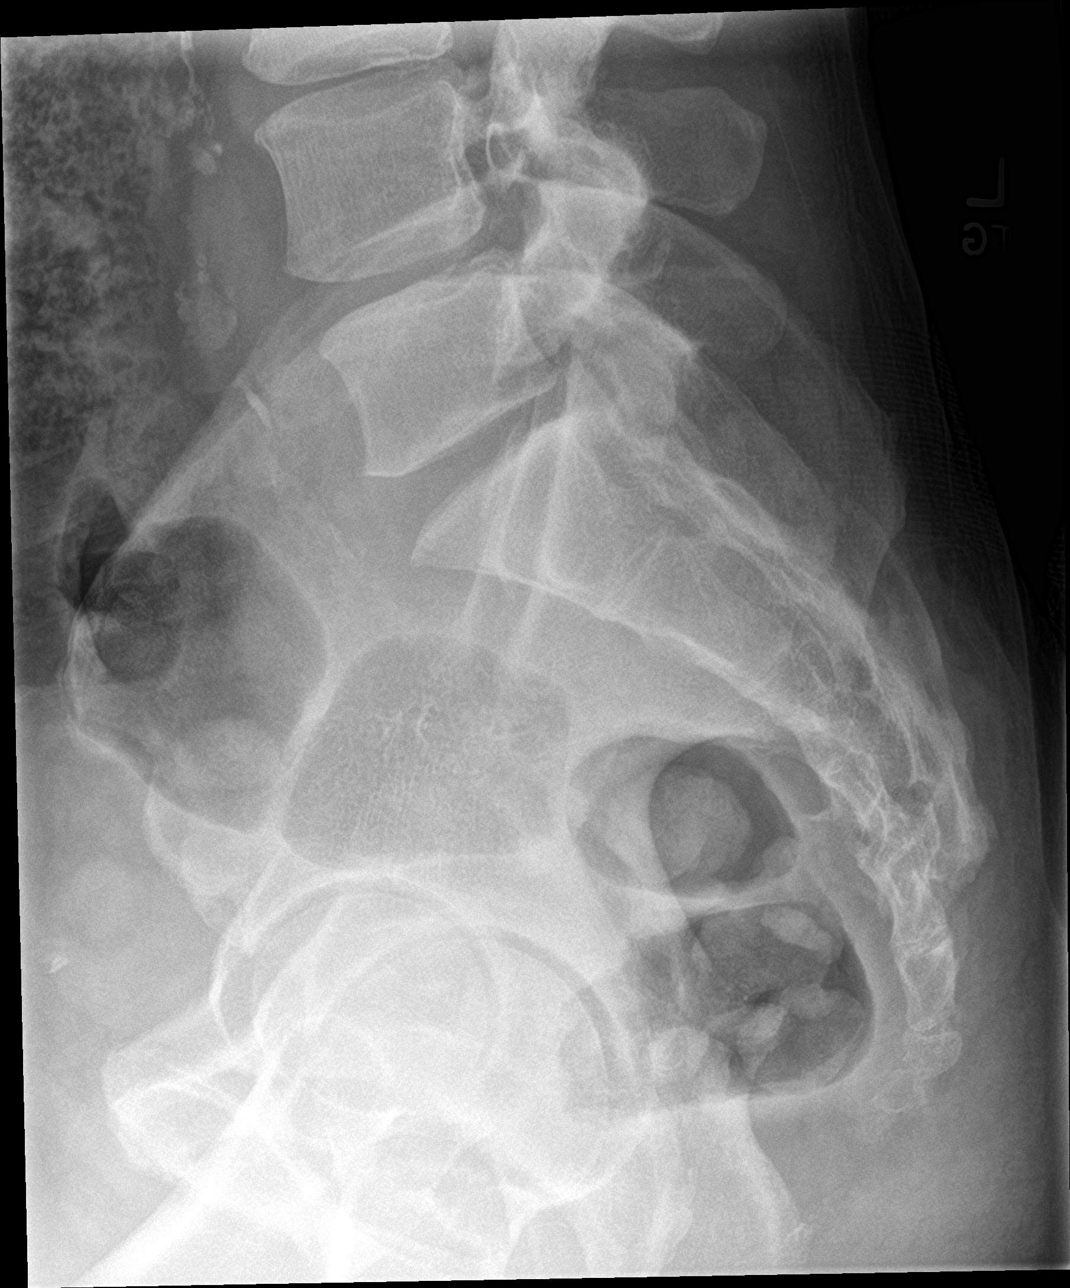

[5 of 5 positions shown; findings below may reference images not displayed]

FINDINGS: Five lumbar type vertebral bodies are well visualized. Vertebral
body height is well maintained. Previously seen transverse process
fractures on the left at L3 and L4 are not well visualized on
today's exam consistent with healing. No pars defects are seen. No
anterolisthesis is noted. No significant disc space narrowing is
seen. Mild aortic calcifications are noted.
IMPRESSION: Healed transverse process fractures on the left at L3 and L4.

No acute abnormality noted.

## 2021-03-19 ENCOUNTER — Telehealth: Payer: Self-pay

## 2021-03-19 NOTE — Telephone Encounter (Signed)
I submitted a PA request for Ubrelvy 100mg  on [facility], Key: [id number] - PA Case ID: .  Awaiting determination.

## 2021-03-27 NOTE — Telephone Encounter (Signed)
N/A on November 28 We received your request for prior authorization for UBRELVY TAB 100MG  for the above member; however, [facility] has a denied request on file for UBRELVY TAB 100MG  for this member. Please follow the appeals process outlined in the original denial or contact Prior Authorization Department at [phone] for further questions.

## 2021-03-30 ENCOUNTER — Telehealth: Payer: Self-pay

## 2021-03-30 NOTE — Telephone Encounter (Signed)
Pt left VM on nurse line. Returned call, stated he tested pos for COVID at home yesterday. PCP not in ofc. Wondering about covid meds. Advised if sxs are mild, treat symptomatically with OTC meds. If sxs worsen, come to UC for further eval. Pt acknowledged.

## 2021-03-31 ENCOUNTER — Ambulatory Visit: Payer: 59

## 2021-04-02 ENCOUNTER — Encounter: Payer: Self-pay | Admitting: *Deleted

## 2021-04-02 NOTE — Telephone Encounter (Signed)
Appeal letter done.  To AA/MD for signature then will fax.

## 2021-04-02 NOTE — Telephone Encounter (Signed)
I called [facility] spoke to Bradly Chris he gave me appeal # [phone] fax [phone] appeal for ubrelvy.

## 2021-04-03 NOTE — Telephone Encounter (Signed)
Aaron Frey appeal letter signed by Dr Lucia Gaskins. Appeal letter, office note, and denial letter faxed to [facility]. Received a receipt of confirmation.

## 2021-04-12 NOTE — Telephone Encounter (Signed)
I called [facility] and received approval for Ubrelvy 100mg  tabs #16/ 30 days until 07-07-2021 REF # [id number].

## 2021-04-19 ENCOUNTER — Other Ambulatory Visit: Payer: Self-pay | Admitting: Family Medicine

## 2021-04-30 ENCOUNTER — Other Ambulatory Visit: Payer: Self-pay

## 2021-04-30 DIAGNOSIS — M1611 Unilateral primary osteoarthritis, right hip: Secondary | ICD-10-CM

## 2021-04-30 MED ORDER — NAPROXEN 500 MG PO TABS: 500.0000 mg | ORAL_TABLET | Freq: Two times a day (BID) | ORAL | 3 refills | Status: AC

## 2021-04-30 NOTE — Telephone Encounter (Signed)
Key: [id number]) for emgality 120mg / ml initiated on [facility].

## 2021-05-02 NOTE — Telephone Encounter (Signed)
Received denial for Emgality.  The denial was based on our criteria for Emgality Inj 120mg /Ml.  Per your health plan's criteria, this drug is covered if you meet the following: (1) There are paid claims or your doctor provides medical records (for example: chart notes) showing that you have tried or cannot use both Aimovig and Ajovy.

## 2021-05-02 NOTE — Addendum Note (Signed)
Addended by: Oliver Hum S on: 05/02/2021 10:03 AM   Modules accepted: Orders

## 2021-05-15 ENCOUNTER — Telehealth: Payer: Self-pay | Admitting: Neurology

## 2021-05-15 MED ORDER — EMGALITY 120 MG/ML ~~LOC~~ SOAJ: 120.0000 mg | SUBCUTANEOUS | 1 refills | Status: DC

## 2021-05-15 NOTE — Telephone Encounter (Signed)
Refill sent.

## 2021-05-15 NOTE — Addendum Note (Signed)
Addended by: Bertram Savin on: 05/15/2021 10:25 AM   Modules accepted: Orders

## 2021-05-15 NOTE — Telephone Encounter (Signed)
Pt request refill  for Galcanezumab-gnlm (EMGALITY) 120 MG/ML SOAJ at [id number].  Recheduled pt appt on 07/04/21 at 10:00am with Aundra Millet, NP

## 2021-05-16 NOTE — Telephone Encounter (Signed)
Received PA request for Emgality

## 2021-05-17 ENCOUNTER — Ambulatory Visit: Payer: 59 | Admitting: Adult Health

## 2021-05-21 NOTE — Telephone Encounter (Signed)
Aaron Frey (Key: [id number]) Rx #: [id number] Emgality 120MG /ML auto-injectors (migraine) Waiting on PA Approval

## 2021-05-21 NOTE — Telephone Encounter (Signed)
Medication: NURTEC 75 MG TBDP Prior authorization determination received on 02/19/2021 Medication has been approved Approval dates: 02/19/2021-02/19/2022

## 2021-05-24 ENCOUNTER — Other Ambulatory Visit: Payer: Self-pay | Admitting: *Deleted

## 2021-05-24 ENCOUNTER — Encounter: Payer: Self-pay | Admitting: *Deleted

## 2021-05-24 NOTE — Telephone Encounter (Signed)
Per Dr Lucia Gaskins since [place] is not covered by patient's  insurance Dr.Ahern wanted to switch to Ajovy. Sent a message through mychart to patient to see if its ok for him to switch before I placed the order to the pharmacy.Waiting on patient  to respond

## 2021-06-05 ENCOUNTER — Other Ambulatory Visit: Payer: Self-pay | Admitting: Family Medicine

## 2021-06-05 MED ORDER — NURTEC 75 MG PO TBDP: 1.0000 | ORAL_TABLET | Freq: Every day | ORAL | 3 refills | Status: DC | PRN

## 2021-06-13 NOTE — Telephone Encounter (Signed)
[facility]) inform Emgality requires a PA. Key code: [id number]

## 2021-06-13 NOTE — Telephone Encounter (Signed)
Spoke with the patient.  He is amenable to switching to Ajovy.  His questions were answered.  He states a [facility] in [place] has mailed him his other medication and he wants to stick with them.  He is going to call and find out which one it is and he will give [place] a call so we can place the order. Will cancel Emgality.

## 2021-06-14 ENCOUNTER — Telehealth: Payer: Self-pay | Admitting: Neurology

## 2021-06-14 MED ORDER — AJOVY 225 MG/1.5ML ~~LOC~~ SOAJ: 1.5000 mL | SUBCUTANEOUS | 2 refills | Status: DC

## 2021-06-14 NOTE — Telephone Encounter (Signed)
I called Aaron Frey back, the address [facility] preferred [facility] is : # [place] at [facility].  I have sent order for ajovy to this pharmacy. Emgality d/c.

## 2021-06-14 NOTE — Telephone Encounter (Signed)
PA for ajovy has been approved.  You can now fill your prescription for this medication. STEPS: VALID 06/14/2021 - 12/12/2021 Pharmacy notified of approval.

## 2021-06-14 NOTE — Telephone Encounter (Signed)
[facility]) requesting a new prescription to replace Emgality. Would like a call back.  Contact info: [phone]

## 2021-06-14 NOTE — Telephone Encounter (Signed)
[facility]) have the prescription for Fremanezumab-vfrm (AJOVY) 225 MG/1.5ML SOAJ We also need a PA sent. Would like a call from the nurse

## 2021-06-14 NOTE — Telephone Encounter (Signed)
I called [facility] and obtained pharmacy benefit information. I have submitted PA through cmm.   Key: [id number] Your information has been sent to [facility].

## 2021-06-14 NOTE — Addendum Note (Signed)
Addended by: Ann Maki on: 06/14/2021 10:45 AM   Modules accepted: Orders

## 2021-06-19 ENCOUNTER — Other Ambulatory Visit: Payer: Self-pay

## 2021-06-19 ENCOUNTER — Encounter: Payer: Self-pay | Admitting: Family Medicine

## 2021-06-19 ENCOUNTER — Ambulatory Visit (INDEPENDENT_AMBULATORY_CARE_PROVIDER_SITE_OTHER): Payer: 59 | Admitting: Family Medicine

## 2021-06-19 VITALS — BP 136/82 | HR 78 | Ht 72.0 in | Wt 225.0 lb

## 2021-06-19 DIAGNOSIS — I10 Essential (primary) hypertension: Secondary | ICD-10-CM | POA: Diagnosis not present

## 2021-06-19 DIAGNOSIS — G43709 Chronic migraine without aura, not intractable, without status migrainosus: Secondary | ICD-10-CM | POA: Diagnosis not present

## 2021-06-19 DIAGNOSIS — E781 Pure hyperglyceridemia: Secondary | ICD-10-CM | POA: Diagnosis not present

## 2021-06-19 DIAGNOSIS — M25551 Pain in right hip: Secondary | ICD-10-CM | POA: Diagnosis not present

## 2021-06-19 DIAGNOSIS — M5030 Other cervical disc degeneration, unspecified cervical region: Secondary | ICD-10-CM

## 2021-06-19 DIAGNOSIS — M1611 Unilateral primary osteoarthritis, right hip: Secondary | ICD-10-CM

## 2021-06-19 MED ORDER — GABAPENTIN 300 MG PO CAPS: 300.0000 mg | ORAL_CAPSULE | Freq: Every evening | ORAL | 3 refills | Status: DC | PRN

## 2021-06-19 MED ORDER — SILDENAFIL CITRATE 100 MG PO TABS: 50.0000 mg | ORAL_TABLET | Freq: Every day | ORAL | 11 refills | Status: DC | PRN

## 2021-06-19 MED ORDER — AMLODIPINE BESYLATE 10 MG PO TABS: 10.0000 mg | ORAL_TABLET | Freq: Every day | ORAL | 2 refills | Status: DC

## 2021-06-19 NOTE — Assessment & Plan Note (Signed)
Management per neurology.  Stable with current medications. 

## 2021-06-19 NOTE — Assessment & Plan Note (Signed)
Using gabapentin as needed for radicular symptoms.

## 2021-06-19 NOTE — Assessment & Plan Note (Signed)
Requesting referral to orthopedics for continued pain.

## 2021-06-19 NOTE — Progress Notes (Signed)
Aaron Frey - 61 y.o. male MRN [id number]  Date of birth: 1961-04-01  Subjective Chief Complaint  Patient presents with   Follow-up    HPI Aaron Frey is a 61 year old male here today for follow-up visit.  Continues to do well with amlodipine for management of hypertension.  No side effects noted with medication.  He denies chest pain, shortness of breath, palpitations, or vision changes.  He is seeing neurology for management of headaches.  Recently started on Ajovy with Nurtec as needed for breakthrough.  So far this is working okay for him.  No issues with tolerance.  He is taking gabapentin for cervical degenerative disc disease and radicular symptoms.  Stable at this time.  Continues to have right hip pain.  Has had injections previously but these were not very helpful.  He would like referral to orthopedics.  ROS:  A comprehensive ROS was completed and negative except as noted per HPI   Allergies  Allergen Reactions   Candesartan     Tingling, not able to  tolerate.    Past Medical History:  Diagnosis Date   C2 cervical fracture (HCC) 12/29/2013   C5 vertebral fracture (HCC) 12/29/2013   Class 1 obesity due to excess calories without serious comorbidity with body mass index (BMI) of 32.0 to 32.9 in adult 08/30/2016   Concussion 12/29/2013   Former tobacco use    Hypertension    Hypertriglyceridemia without hypercholesterolemia 09/03/2016   Lumbar transverse process fracture (HCC) 12/29/2013   Migraine    Sacrum and coccyx fracture (HCC) 12/29/2013    Past Surgical History:  Procedure Laterality Date   ACHILLES TENDON REPAIR      Social History   Socioeconomic History   Marital status: Single    Spouse name: Not on file   Number of children: 4   Years of education: Not on file   Highest education level: Not on file  Occupational History   Not on file  Tobacco Use   Smoking status: Former    Packs/day: 0.10    Types: Cigarettes    Quit date: 2015    Years since  quitting: 8.1   Smokeless tobacco: Never   Tobacco comments:    3-4 cigarettes/day  Vaping Use   Vaping Use: Never used  Substance and Sexual Activity   Alcohol use: Not Currently    Comment: Occasionally   Drug use: No    Types: Marijuana   Sexual activity: Not on file  Other Topics Concern   Not on file  Social History Narrative   Lives alone   Right handed   Caffeine: unknown    Social Determinants of Health   Financial Resource Strain: Not on file  Food Insecurity: Not on file  Transportation Needs: Not on file  Physical Activity: Not on file  Stress: Not on file  Social Connections: Not on file    Family History  Problem Relation Age of Onset   Cancer Mother    Stroke Mother    Dementia Mother    Migraines Mother    ALS Father    Migraines Daughter    Migraines Son     Health Maintenance  Topic Date Due   COVID-19 Vaccine (1) Never done   HIV Screening  Never done   Zoster Vaccines- Shingrix (2 of 2) 12/02/2016   Fecal DNA (Cologuard)  09/26/2019   TETANUS/TDAP  12/30/2023   INFLUENZA VACCINE  Completed   Hepatitis C Screening  Completed   HPV VACCINES  Aged Out     ----------------------------------------------------------------------------------------------------------------------------------------------------------------------------------------------------------------- Physical Exam BP 136/82    Pulse 78    Ht 6' (1.829 m)    Wt 225 lb (102.1 kg)    SpO2 98%    BMI 30.52 kg/m   Physical Exam Constitutional:      Appearance: Normal appearance.  Eyes:     General: No scleral icterus. Cardiovascular:     Rate and Rhythm: Normal rate and regular rhythm.  Pulmonary:     Effort: Pulmonary effort is normal.     Breath sounds: Normal breath sounds.  Musculoskeletal:     Cervical back: Neck supple.  Neurological:     General: No focal deficit present.     Mental Status: He is alert.  Psychiatric:        Mood and Affect: Mood normal.         Behavior: Behavior normal.    ------------------------------------------------------------------------------------------------------------------------------------------------------------------------------------------------------------------- Assessment and Plan  Hypertension Blood pressure well controlled current dose of amlodipine.  Continue.  Updated labs ordered.  Migraine Management per neurology.  Stable with current medications.  Degenerative disc disease, cervical Using gabapentin as needed for radicular symptoms.  Primary osteoarthritis of right hip Requesting referral to orthopedics for continued pain.   Meds ordered this encounter  Medications   amLODipine (NORVASC) 10 MG tablet    Sig: Take 1 tablet (10 mg total) by mouth daily.    Dispense:  90 tablet    Refill:  2   gabapentin (NEURONTIN) 300 MG capsule    Sig: Take 1 capsule (300 mg total) by mouth at bedtime as needed.    Dispense:  90 capsule    Refill:  3   sildenafil (VIAGRA) 100 MG tablet    Sig: Take 0.5-1 tablets (50-100 mg total) by mouth daily as needed for erectile dysfunction.    Dispense:  10 tablet    Refill:  11    Return in about 6 months (around 12/17/2021) for HTN.    This visit occurred during the SARS-CoV-2 public health emergency.  Safety protocols were in place, including screening questions prior to the visit, additional usage of staff PPE, and extensive cleaning of exam room while observing appropriate contact time as indicated for disinfecting solutions.

## 2021-06-19 NOTE — Assessment & Plan Note (Signed)
Blood pressure well controlled current dose of amlodipine.  Continue.  Updated labs ordered.

## 2021-06-21 LAB — COMPLETE METABOLIC PANEL WITH GFR
AG Ratio: 1.4 (calc) (ref 1.0–2.5)
ALT: 21 U/L (ref 9–46)
AST: 22 U/L (ref 10–35)
Albumin: 4.6 g/dL (ref 3.6–5.1)
Alkaline phosphatase (APISO): 62 U/L (ref 35–144)
BUN/Creatinine Ratio: 14 (calc) (ref 6–22)
BUN: 20 mg/dL (ref 7–25)
CO2: 27 mmol/L (ref 20–32)
Calcium: 9.8 mg/dL (ref 8.6–10.3)
Chloride: 103 mmol/L (ref 98–110)
Creat: 1.42 mg/dL — ABNORMAL HIGH (ref 0.70–1.30)
Globulin: 3.4 g/dL (calc) (ref 1.9–3.7)
Glucose, Bld: 117 mg/dL (ref 65–139)
Potassium: 4.3 mmol/L (ref 3.5–5.3)
Sodium: 139 mmol/L (ref 135–146)
Total Bilirubin: 0.5 mg/dL (ref 0.2–1.2)
Total Protein: 8 g/dL (ref 6.1–8.1)
eGFR: 57 mL/min/{1.73_m2} — ABNORMAL LOW (ref >=60)

## 2021-06-21 LAB — CBC WITH DIFFERENTIAL/PLATELET
Absolute Monocytes: 471 cells/uL (ref 200–950)
Basophils Absolute: 53 cells/uL (ref 0–200)
Basophils Relative: 0.7 %
Eosinophils Absolute: 68 cells/uL (ref 15–500)
Eosinophils Relative: 0.9 %
HCT: 38.6 % (ref 38.5–50.0)
Hemoglobin: 12.7 g/dL — ABNORMAL LOW (ref 13.2–17.1)
Lymphs Abs: 2500 cells/uL (ref 850–3900)
MCH: 27.5 pg (ref 27.0–33.0)
MCHC: 32.9 g/dL (ref 32.0–36.0)
MCV: 83.5 fL (ref 80.0–100.0)
MPV: 9.6 fL (ref 7.5–12.5)
Monocytes Relative: 6.2 %
Neutro Abs: 4507 cells/uL (ref 1500–7800)
Neutrophils Relative %: 59.3 %
Platelets: 331 10*3/uL (ref 140–400)
RBC: 4.62 10*6/uL (ref 4.20–5.80)
RDW: 14.5 % (ref 11.0–15.0)
Total Lymphocyte: 32.9 %
WBC: 7.6 10*3/uL (ref 3.8–10.8)

## 2021-06-21 LAB — LIPID PANEL W/REFLEX DIRECT LDL
Cholesterol: 153 mg/dL (ref <200)
HDL: 35 mg/dL — ABNORMAL LOW (ref >=40)
LDL Cholesterol (Calc): 85 mg/dL (calc)
Non-HDL Cholesterol (Calc): 118 mg/dL (calc) (ref <130)
Total CHOL/HDL Ratio: 4.4 (calc) (ref <5.0)
Triglycerides: 239 mg/dL — ABNORMAL HIGH (ref <150)

## 2021-06-21 LAB — T4, FREE: Free T4: 0.9 ng/dL (ref 0.8–1.8)

## 2021-06-21 LAB — TEST AUTHORIZATION

## 2021-06-21 LAB — TSH: TSH: 7.76 mIU/L — ABNORMAL HIGH (ref 0.40–4.50)

## 2021-06-22 ENCOUNTER — Encounter: Payer: Self-pay | Admitting: Family Medicine

## 2021-06-25 ENCOUNTER — Ambulatory Visit (INDEPENDENT_AMBULATORY_CARE_PROVIDER_SITE_OTHER): Payer: 59 | Admitting: Physician Assistant

## 2021-06-25 ENCOUNTER — Encounter: Payer: Self-pay | Admitting: Physician Assistant

## 2021-06-25 ENCOUNTER — Other Ambulatory Visit: Payer: Self-pay

## 2021-06-25 VITALS — Ht 72.0 in | Wt 227.8 lb

## 2021-06-25 DIAGNOSIS — M1611 Unilateral primary osteoarthritis, right hip: Secondary | ICD-10-CM
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
## 2021-06-26 NOTE — Telephone Encounter (Signed)
Meredith from [facility] reached out this am to make [place] aware that the patient is going to get his Ajovy injection done there monthly.  ?

## 2021-06-29 ENCOUNTER — Other Ambulatory Visit: Payer: Self-pay | Admitting: Family Medicine

## 2021-06-29 DIAGNOSIS — D649 Anemia, unspecified: Secondary | ICD-10-CM

## 2021-06-29 DIAGNOSIS — R7989 Other specified abnormal findings of blood chemistry: Secondary | ICD-10-CM

## 2021-07-04 ENCOUNTER — Encounter: Payer: Self-pay | Admitting: Adult Health

## 2021-07-04 ENCOUNTER — Telehealth: Payer: Self-pay | Admitting: Family Medicine

## 2021-07-04 ENCOUNTER — Other Ambulatory Visit: Payer: Self-pay

## 2021-07-04 ENCOUNTER — Ambulatory Visit (INDEPENDENT_AMBULATORY_CARE_PROVIDER_SITE_OTHER): Payer: 59 | Admitting: Adult Health

## 2021-07-04 VITALS — BP 149/82 | HR 76 | Ht 72.0 in | Wt 230.0 lb

## 2021-07-04 DIAGNOSIS — G43709 Chronic migraine without aura, not intractable, without status migrainosus: Secondary | ICD-10-CM | POA: Diagnosis not present

## 2021-07-04 NOTE — Patient Instructions (Addendum)
Your Plan: ? ?Start Ajovy  ?Continue using Nurtrec for abortive therapy ?Keep a headache journal  ? ? ? ?Thank you for coming to see [place] at [facility]. I hope we have been able to provide you high quality care today. ? ?You may receive a patient satisfaction survey over the next few weeks. We would appreciate your feedback and comments so that we may continue to improve ourselves and the health of our patients. ? ?
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
## 2021-07-04 NOTE — Telephone Encounter (Signed)
Patient dropped of FMLA paperwork from his insurance company - Patient informed of a possible fee and a 3-5 day turn around. Paper work placed in Chartered loss adjuster (Dr. Ashley Royalty) - lmr. ?

## 2021-07-05 NOTE — Progress Notes (Signed)
Received Epic notification that pt has not read MyChart message regarding results.  Rechecked chart and    ?seen by patient Aaron Frey on 07/04/2021  4:03 PM  ?  ? ?  ? ?Tiajuana Amass, CMA ?

## 2021-07-06 NOTE — Telephone Encounter (Signed)
LVM for patient to pickup documents. Advised of $29 fee. Copy sent to scan. Documents place in accordian file. ?

## 2021-07-09 LAB — COLOGUARD: COLOGUARD: NEGATIVE

## 2021-07-23 ENCOUNTER — Other Ambulatory Visit: Payer: Self-pay

## 2021-08-17 ENCOUNTER — Other Ambulatory Visit: Payer: Self-pay | Admitting: Physician Assistant

## 2021-08-17 DIAGNOSIS — M1611 Unilateral primary osteoarthritis, right hip: Secondary | ICD-10-CM

## 2021-08-23 NOTE — Progress Notes (Signed)
Surgical Instructions ? ? ? Your procedure is scheduled on 09/04/21. ? Report to [facility] Main Entrance "A" at 7:30 A.M., then check in with the Admitting office. ? Call this number if you have problems the morning of surgery: ? [phone] ? ? If you have any questions prior to your surgery date call [phone]: Open Monday-Friday 8am-4pm ? ? ? Remember: ? Do not eat after midnight the night before your surgery ? ?You may drink clear liquids until 6:30 the morning of your surgery.   ?Clear liquids allowed are: Water, Non-Citrus Juices (without pulp), Carbonated Beverages, Clear Tea, Black Coffee ONLY (NO MILK, CREAM OR POWDERED CREAMER of any kind), and Gatorade ? ?Please complete your PRE-SURGERY ENSURE that was provided to you by 6:30 the morning of surgery.  Please, if able, drink it in one setting. DO NOT SIP.  ?  ? Take these medicines the morning of surgery with A SIP OF WATER:  ?amLODipine (NORVASC) ?UBRELVY - if needed ? ? ?As of today, STOP taking any Aspirin (unless otherwise instructed by your surgeon) Aleve, Naproxen, Ibuprofen, Motrin, Advil, Goody's, BC's, all herbal medications, fish oil, and all vitamins. ? ?         ?Do not wear jewelry  ?Do not wear lotions, powders, colognes, or deodorant. ?Do not shave 48 hours prior to surgery.  Men may shave face and neck. ?Do not bring valuables to the hospital. ? ? ?[place] [facility] is not responsible for any belongings or valuables. .  ? ?Do NOT Smoke (Tobacco/Vaping)  24 hours prior to your procedure ? ?If you use a CPAP at night, you may bring your mask for your overnight stay. ?  ?Contacts, glasses, hearing aids, dentures or partials may not be worn into surgery, please bring cases for these belongings ?  ?For patients admitted to the hospital, discharge time will be determined by your treatment team. ?  ?Patients discharged the day of surgery will not be allowed to drive home, and someone needs to stay with them for 24 hours. ? ? ?SURGICAL WAITING ROOM  VISITATION ?Patients having surgery or a procedure in a hospital may have two support people. ?Children under the age of 38 must have an adult with them who is not the patient. ?They may stay in the waiting area during the procedure and may switch out with other visitors. If the patient needs to stay at the hospital during part of their recovery, the visitor guidelines for inpatient rooms apply. ? ?Please refer to the [facility] website for the visitor guidelines for Inpatients (after your surgery is over and you are in a regular room).  ? ? ? ?Special instructions:   ? ?Oral Hygiene is also important to reduce your risk of infection.  Remember - BRUSH YOUR TEETH THE MORNING OF SURGERY WITH YOUR REGULAR TOOTHPASTE ? ? ?[facility]rey- Preparing For Surgery ? ?Before surgery, you can play an important role. Because skin is not sterile, your skin needs to be as free of germs as possible. You can reduce the number of germs on your skin by washing with CHG (chlorahexidine gluconate) Soap before surgery.  CHG is an antiseptic cleaner which kills germs and bonds with the skin to continue killing germs even after washing.   ? ? ?Please do not use if you have an allergy to CHG or antibacterial soaps. If your skin becomes reddened/irritated stop using the CHG.  ?Do not shave (including legs and underarms) for at least 48 hours prior to first  CHG shower. It is OK to shave your face. ? ?Please follow these instructions carefully. ?  ? ? Shower the NIGHT BEFORE SURGERY and the MORNING OF SURGERY with CHG Soap.  ? If you chose to wash your hair, wash your hair first as usual with your normal shampoo. After you shampoo, rinse your hair and body thoroughly to remove the shampoo.  Then [employer] and genitals (private parts) with your normal soap and rinse thoroughly to remove soap. ? ?After that Use CHG Soap as you would any other liquid soap. You can apply CHG directly to the skin and wash gently with a scrungie or a clean washcloth.   ? ?Apply the CHG Soap to your body ONLY FROM THE NECK DOWN.  Do not use on open wounds or open sores. Avoid contact with your eyes, ears, mouth and genitals (private parts). Wash Face and genitals (private parts)  with your normal soap.  ? ?Wash thoroughly, paying special attention to the area where your surgery will be performed. ? ?Thoroughly rinse your body with warm water from the neck down. ? ?DO NOT shower/wash with your normal soap after using and rinsing off the CHG Soap. ? ?Pat yourself dry with a CLEAN TOWEL. ? ?Wear CLEAN PAJAMAS to bed the night before surgery ? ?Place CLEAN SHEETS on your bed the night before your surgery ? ?DO NOT SLEEP WITH PETS. ? ? ?Day of Surgery: ? ?Take a shower with CHG soap. ?Wear Clean/Comfortable clothing the morning of surgery ?Do not apply any deodorants/lotions.   ?Remember to brush your teeth WITH YOUR REGULAR TOOTHPASTE. ? ? ? ?If you received a COVID test during your pre-op visit, it is requested that you wear a mask when out in public, stay away from anyone that may not be feeling well, and notify your surgeon if you develop symptoms. If you have been in contact with anyone that has tested positive in the last 10 days, please notify your surgeon. ? ?  ?Please read over the following fact sheets that you were given.   ?

## 2021-08-24 ENCOUNTER — Encounter (HOSPITAL_COMMUNITY): Payer: Self-pay

## 2021-08-24 ENCOUNTER — Encounter (HOSPITAL_COMMUNITY)
Admission: RE | Admit: 2021-08-24 | Discharge: 2021-08-24 | Disposition: A | Discharge status: Home / Self Care | Payer: 59 | Source: Ambulatory Visit | Attending: Orthopaedic Surgery | Admitting: Orthopaedic Surgery

## 2021-08-24 ENCOUNTER — Other Ambulatory Visit: Payer: Self-pay

## 2021-08-24 VITALS — BP 143/80 | HR 74 | Temp 98.6°F | Resp 17 | Ht 72.0 in | Wt 226.2 lb

## 2021-08-24 DIAGNOSIS — M1611 Unilateral primary osteoarthritis, right hip: Secondary | ICD-10-CM

## 2021-08-24 DIAGNOSIS — Z01818 Encounter for other preprocedural examination: Secondary | ICD-10-CM

## 2021-08-24 LAB — CBC
HCT: 42.4 % (ref 39.0–52.0)
Hemoglobin: 13.7 g/dL (ref 13.0–17.0)
MCH: 28 pg (ref 26.0–34.0)
MCHC: 32.3 g/dL (ref 30.0–36.0)
MCV: 86.7 fL (ref 80.0–100.0)
Platelets: 284 10*3/uL (ref 150–400)
RBC: 4.89 MIL/uL (ref 4.22–5.81)
RDW: 13.7 % (ref 11.5–15.5)
WBC: 7.3 10*3/uL (ref 4.0–10.5)
nRBC: 0 % (ref 0.0–0.2)

## 2021-08-24 LAB — BASIC METABOLIC PANEL
Anion gap: 10 (ref 5–15)
BUN: 14 mg/dL (ref 6–20)
CO2: 24 mmol/L (ref 22–32)
Calcium: 9.7 mg/dL (ref 8.9–10.3)
Chloride: 105 mmol/L (ref 98–111)
Creatinine, Ser: 1.33 mg/dL — ABNORMAL HIGH (ref 0.61–1.24)
GFR, Estimated: >60 mL/min (ref >60)
Glucose, Bld: 86 mg/dL (ref 70–99)
Potassium: 4.3 mmol/L (ref 3.5–5.1)
Sodium: 139 mmol/L (ref 135–145)

## 2021-08-24 LAB — SURGICAL PCR SCREEN
MRSA, PCR: NEGATIVE
Staphylococcus aureus: POSITIVE — AB

## 2021-08-24 NOTE — Progress Notes (Addendum)
PCP - Everrett Coombe ?Cardiologist - denies cardiologist or any cardiac issues ? ?PPM/ICD - denies ?Chest x-ray - denies ?EKG - 08/24/2021 ?Stress Test - denies ?ECHO - denies ?Cardiac Cath - denies ? ?Sleep Study - denies ? ? ?Fasting Blood Sugar - not DM ? ?Blood Thinner Instructions: As of 08/28/21 STOP taking any Aspirin (unless otherwise instructed by your surgeon) Aleve, Naproxen, Ibuprofen, Motrin, Advil, Goody's, BC's, all herbal medications, fish oil, and all vitamins. ? ?ERAS Protcol - order + drink- ensure given at PAT appointment ? ?COVID TEST- N/A ? ?Pt states he live alone and was planning on staying in the hospital after his surgery, then finding a cab to go home. Patients states he does not have any friends or family that can help him. Dr Eliberto Ivory scheduler Sherrie notified to reach out to patient about this as well as other questions that patient has about his surgery.  ? ?Anesthesia review: no ? ?Patient denies shortness of breath, fever, cough and chest pain at PAT appointment ? ? ?All instructions explained to the patient, with a verbal understanding of the material. Patient agrees to go over the instructions while at home for a better understanding. The opportunity to ask questions was provided. ? ? ?

## 2021-08-24 NOTE — Progress Notes (Signed)
Surgical Instructions ? ? ? Your procedure is scheduled on 09/04/21. ? Report to [facility] Main Entrance "A" at 7:30 A.M., then check in with the Admitting office. ? Call this number if you have problems the morning of surgery: ? [phone] ? ? If you have any questions prior to your surgery date call [phone]: Open Monday-Friday 8am-4pm ? ? ? Remember: ? Do not eat after midnight the night before your surgery ? ?You may drink clear liquids until 6:30 the morning of your surgery.   ?Clear liquids allowed are: Water, Non-Citrus Juices (without pulp), Carbonated Beverages, Clear Tea, Black Coffee ONLY (NO MILK, CREAM OR POWDERED CREAMER of any kind), and Gatorade ? ?Please complete your PRE-SURGERY ENSURE that was provided to you by 6:30 the morning of surgery.  Please, if able, drink it in one setting. DO NOT SIP.  ?  ? Take these medicines the morning of surgery with A SIP OF WATER:  ?amLODipine (NORVASC) ?UBRELVY - if needed ? ? ?As of 08/28/21 STOP taking any Aspirin (unless otherwise instructed by your surgeon) Aleve, Naproxen, Ibuprofen, Motrin, Advil, Goody's, BC's, all herbal medications, fish oil, and all vitamins. ? ?         ?Do not wear jewelry  ?Do not wear lotions, powders, colognes, or deodorant. ?Do not shave 48 hours prior to surgery.  Men may shave face and neck. ?Do not bring valuables to the hospital. ? ? ?[facility] is not responsible for any belongings or valuables. .  ? ?Do NOT Smoke (Tobacco/Vaping)  24 hours prior to your procedure ? ?If you use a CPAP at night, you may bring your mask for your overnight stay. ?  ?Contacts, glasses, hearing aids, dentures or partials may not be worn into surgery, please bring cases for these belongings ?  ?For patients admitted to the hospital, discharge time will be determined by your treatment team. ?  ?Patients discharged the day of surgery will not be allowed to drive home, and someone needs to stay with them for 24 hours. ? ? ?SURGICAL WAITING ROOM  VISITATION ?Patients having surgery or a procedure in a hospital may have two support people. ?Children under the age of 68 must have an adult with them who is not the patient. ?They may stay in the waiting area during the procedure and may switch out with other visitors. If the patient needs to stay at the hospital during part of their recovery, the visitor guidelines for inpatient rooms apply. ? ?Please refer to the [facility] website for the visitor guidelines for Inpatients (after your surgery is over and you are in a regular room).  ? ? ? ?Special instructions:   ? ?Oral Hygiene is also important to reduce your risk of infection.  Remember - BRUSH YOUR TEETH THE MORNING OF SURGERY WITH YOUR REGULAR TOOTHPASTE ? ? ?[facility]- Preparing For Surgery ? ?Before surgery, you can play an important role. Because skin is not sterile, your skin needs to be as free of germs as possible. You can reduce the number of germs on your skin by washing with CHG (chlorahexidine gluconate) Soap before surgery.  CHG is an antiseptic cleaner which kills germs and bonds with the skin to continue killing germs even after washing.   ? ? ?Please do not use if you have an allergy to CHG or antibacterial soaps. If your skin becomes reddened/irritated stop using the CHG.  ?Do not shave (including legs and underarms) for at least 48 hours prior to first  CHG shower. It is OK to shave your face. ? ?Please follow these instructions carefully. ?  ? ? Shower the NIGHT BEFORE SURGERY and the MORNING OF SURGERY with CHG Soap.  ? If you chose to wash your hair, wash your hair first as usual with your normal shampoo. After you shampoo, rinse your hair and body thoroughly to remove the shampoo.  Then [employer] and genitals (private parts) with your normal soap and rinse thoroughly to remove soap. ? ?After that Use CHG Soap as you would any other liquid soap. You can apply CHG directly to the skin and wash gently with a scrungie or a clean washcloth.   ? ?Apply the CHG Soap to your body ONLY FROM THE NECK DOWN.  Do not use on open wounds or open sores. Avoid contact with your eyes, ears, mouth and genitals (private parts). Wash Face and genitals (private parts)  with your normal soap.  ? ?Wash thoroughly, paying special attention to the area where your surgery will be performed. ? ?Thoroughly rinse your body with warm water from the neck down. ? ?DO NOT shower/wash with your normal soap after using and rinsing off the CHG Soap. ? ?Pat yourself dry with a CLEAN TOWEL. ? ?Wear CLEAN PAJAMAS to bed the night before surgery ? ?Place CLEAN SHEETS on your bed the night before your surgery ? ?DO NOT SLEEP WITH PETS. ? ? ?Day of Surgery: ? ?Take a shower with CHG soap. ?Wear Clean/Comfortable clothing the morning of surgery ?Do not apply any deodorants/lotions.   ?Remember to brush your teeth WITH YOUR REGULAR TOOTHPASTE. ? ? ? ?If you received a COVID test during your pre-op visit, it is requested that you wear a mask when out in public, stay away from anyone that may not be feeling well, and notify your surgeon if you develop symptoms. If you have been in contact with anyone that has tested positive in the last 10 days, please notify your surgeon. ? ?  ?Please read over the following fact sheets that you were given.   ?

## 2021-09-04 ENCOUNTER — Ambulatory Visit (HOSPITAL_BASED_OUTPATIENT_CLINIC_OR_DEPARTMENT_OTHER): Payer: 59 | Admitting: Anesthesiology

## 2021-09-04 ENCOUNTER — Encounter (HOSPITAL_COMMUNITY)
Admission: RE | Disposition: A | Discharge status: Home Health | Payer: Self-pay | Source: Home / Self Care | Attending: Orthopaedic Surgery

## 2021-09-04 ENCOUNTER — Ambulatory Visit (HOSPITAL_COMMUNITY): Payer: 59

## 2021-09-04 ENCOUNTER — Observation Stay (HOSPITAL_COMMUNITY)
Admission: RE | Admit: 2021-09-04 | Discharge: 2021-09-06 | Disposition: A | Discharge status: Home Health | Payer: 59 | Attending: Orthopaedic Surgery | Admitting: Orthopaedic Surgery

## 2021-09-04 ENCOUNTER — Encounter (HOSPITAL_COMMUNITY): Payer: Self-pay | Admitting: Orthopaedic Surgery

## 2021-09-04 ENCOUNTER — Other Ambulatory Visit: Payer: Self-pay

## 2021-09-04 ENCOUNTER — Ambulatory Visit (HOSPITAL_COMMUNITY): Payer: 59 | Admitting: Vascular Surgery

## 2021-09-04 ENCOUNTER — Observation Stay (HOSPITAL_COMMUNITY): Payer: 59

## 2021-09-04 DIAGNOSIS — Z96641 Presence of right artificial hip joint: Secondary | ICD-10-CM

## 2021-09-04 DIAGNOSIS — Z6830 Body mass index (BMI) 30.0-30.9, adult: Secondary | ICD-10-CM | POA: Diagnosis not present

## 2021-09-04 DIAGNOSIS — I10 Essential (primary) hypertension: Secondary | ICD-10-CM | POA: Insufficient documentation

## 2021-09-04 DIAGNOSIS — M1611 Unilateral primary osteoarthritis, right hip: Principal | ICD-10-CM | POA: Diagnosis present

## 2021-09-04 DIAGNOSIS — Z87891 Personal history of nicotine dependence: Secondary | ICD-10-CM | POA: Diagnosis not present

## 2021-09-04 HISTORY — PX: TOTAL HIP ARTHROPLASTY: SHX124

## 2021-09-04 LAB — TYPE AND SCREEN
ABO/RH(D): A POS
Antibody Screen: POSITIVE

## 2021-09-04 SURGERY — ARTHROPLASTY, HIP, TOTAL, ANTERIOR APPROACH
Anesthesia: Monitor Anesthesia Care | Site: Hip | Laterality: Right

## 2021-09-04 MED ORDER — ALUM & MAG HYDROXIDE-SIMETH 200-200-20 MG/5ML PO SUSP: 30.0000 mL | ORAL | Status: DC | PRN

## 2021-09-04 MED ORDER — DOCUSATE SODIUM 100 MG PO CAPS
100.0000 mg | ORAL_CAPSULE | Freq: Two times a day (BID) | ORAL | Status: DC
  Administered 2021-09-04 – 2021-09-06 (×4): 100 mg via ORAL
  Filled 2021-09-04 (×4): qty 1

## 2021-09-04 MED ORDER — PANTOPRAZOLE SODIUM 40 MG PO TBEC
40.0000 mg | DELAYED_RELEASE_TABLET | Freq: Every day | ORAL | Status: DC
  Administered 2021-09-04 – 2021-09-06 (×3): 40 mg via ORAL
  Filled 2021-09-04 (×3): qty 1

## 2021-09-04 MED ORDER — PHENOL 1.4 % MT LIQD: 1.0000 | OROMUCOSAL | Status: DC | PRN

## 2021-09-04 MED ORDER — MIDAZOLAM HCL 2 MG/2ML IJ SOLN
INTRAMUSCULAR | Status: AC
  Filled 2021-09-04: qty 2

## 2021-09-04 MED ORDER — ACETAMINOPHEN 325 MG PO TABS: 325.0000 mg | ORAL_TABLET | ORAL | Status: DC | PRN

## 2021-09-04 MED ORDER — ONDANSETRON HCL 4 MG/2ML IJ SOLN
INTRAMUSCULAR | Status: DC | PRN
  Administered 2021-09-04: 4 mg via INTRAVENOUS

## 2021-09-04 MED ORDER — DIPHENHYDRAMINE HCL 12.5 MG/5ML PO ELIX: 12.5000 mg | ORAL_SOLUTION | ORAL | Status: DC | PRN

## 2021-09-04 MED ORDER — MIDAZOLAM HCL 2 MG/2ML IJ SOLN
INTRAMUSCULAR | Status: DC | PRN
  Administered 2021-09-04: 2 mg via INTRAVENOUS

## 2021-09-04 MED ORDER — OXYCODONE HCL 5 MG PO TABS
10.0000 mg | ORAL_TABLET | ORAL | Status: DC | PRN
  Administered 2021-09-04 – 2021-09-05 (×3): 15 mg via ORAL
  Filled 2021-09-04 (×3): qty 3

## 2021-09-04 MED ORDER — ORAL CARE MOUTH RINSE: 15.0000 mL | Freq: Once | OROMUCOSAL | Status: AC

## 2021-09-04 MED ORDER — AMLODIPINE BESYLATE 10 MG PO TABS
10.0000 mg | ORAL_TABLET | Freq: Every day | ORAL | Status: DC
Start: 2021-09-05 — End: 2021-09-06
  Administered 2021-09-05 – 2021-09-06 (×2): 10 mg via ORAL
  Filled 2021-09-04 (×2): qty 1

## 2021-09-04 MED ORDER — CHLORHEXIDINE GLUCONATE 0.12 % MT SOLN
15.0000 mL | Freq: Once | OROMUCOSAL | Status: AC
  Administered 2021-09-04: 15 mL via OROMUCOSAL
  Filled 2021-09-04: qty 15

## 2021-09-04 MED ORDER — ACETAMINOPHEN 325 MG PO TABS: 325.0000 mg | ORAL_TABLET | Freq: Four times a day (QID) | ORAL | Status: DC | PRN

## 2021-09-04 MED ORDER — FENTANYL CITRATE (PF) 250 MCG/5ML IJ SOLN
INTRAMUSCULAR | Status: AC
  Filled 2021-09-04: qty 5

## 2021-09-04 MED ORDER — 0.9 % SODIUM CHLORIDE (POUR BTL) OPTIME
TOPICAL | Status: DC | PRN
Start: 2021-09-04 — End: 2021-09-04
  Administered 2021-09-04: 1000 mL

## 2021-09-04 MED ORDER — ONDANSETRON HCL 4 MG PO TABS: 4.0000 mg | ORAL_TABLET | Freq: Four times a day (QID) | ORAL | Status: DC | PRN

## 2021-09-04 MED ORDER — MENTHOL 3 MG MT LOZG: 1.0000 | LOZENGE | OROMUCOSAL | Status: DC | PRN

## 2021-09-04 MED ORDER — ADULT MULTIVITAMIN W/MINERALS CH
1.0000 | ORAL_TABLET | Freq: Every morning | ORAL | Status: DC
  Administered 2021-09-05 – 2021-09-06 (×2): 1 via ORAL
  Filled 2021-09-04 (×3): qty 1

## 2021-09-04 MED ORDER — SODIUM CHLORIDE 0.9 % IV SOLN: INTRAVENOUS | Status: DC

## 2021-09-04 MED ORDER — METHOCARBAMOL 500 MG PO TABS
500.0000 mg | ORAL_TABLET | Freq: Four times a day (QID) | ORAL | Status: DC | PRN
  Administered 2021-09-04 – 2021-09-05 (×3): 500 mg via ORAL
  Filled 2021-09-04 (×3): qty 1

## 2021-09-04 MED ORDER — METOCLOPRAMIDE HCL 5 MG/ML IJ SOLN: 5.0000 mg | Freq: Three times a day (TID) | INTRAMUSCULAR | Status: DC | PRN

## 2021-09-04 MED ORDER — POVIDONE-IODINE 10 % EX SWAB
2.0000 "application " | Freq: Once | CUTANEOUS | Status: AC
  Administered 2021-09-04: 2 via TOPICAL

## 2021-09-04 MED ORDER — SODIUM CHLORIDE 0.9 % IR SOLN
Status: DC | PRN
Start: 2021-09-04 — End: 2021-09-04
  Administered 2021-09-04: 1000 mL

## 2021-09-04 MED ORDER — FENTANYL CITRATE (PF) 250 MCG/5ML IJ SOLN
INTRAMUSCULAR | Status: DC | PRN
  Administered 2021-09-04: 50 ug via INTRAVENOUS

## 2021-09-04 MED ORDER — PHENYLEPHRINE HCL-NACL 20-0.9 MG/250ML-% IV SOLN
INTRAVENOUS | Status: DC | PRN
Start: 2021-09-04 — End: 2021-09-04
  Administered 2021-09-04: 50 ug/min via INTRAVENOUS

## 2021-09-04 MED ORDER — DEXAMETHASONE SODIUM PHOSPHATE 10 MG/ML IJ SOLN
INTRAMUSCULAR | Status: DC | PRN
  Administered 2021-09-04: 10 mg via INTRAVENOUS

## 2021-09-04 MED ORDER — ONDANSETRON HCL 4 MG/2ML IJ SOLN: 4.0000 mg | Freq: Four times a day (QID) | INTRAMUSCULAR | Status: DC | PRN

## 2021-09-04 MED ORDER — LACTATED RINGERS IV SOLN: INTRAVENOUS | Status: DC

## 2021-09-04 MED ORDER — PROPOFOL 500 MG/50ML IV EMUL
INTRAVENOUS | Status: DC | PRN
  Administered 2021-09-04: 50 ug/kg/min via INTRAVENOUS

## 2021-09-04 MED ORDER — OXYCODONE HCL 5 MG PO TABS: 5.0000 mg | ORAL_TABLET | Freq: Once | ORAL | Status: DC | PRN

## 2021-09-04 MED ORDER — ONDANSETRON HCL 4 MG/2ML IJ SOLN: 4.0000 mg | Freq: Once | INTRAMUSCULAR | Status: DC | PRN

## 2021-09-04 MED ORDER — FENTANYL CITRATE (PF) 100 MCG/2ML IJ SOLN: 25.0000 ug | INTRAMUSCULAR | Status: DC | PRN

## 2021-09-04 MED ORDER — ASPIRIN 81 MG PO CHEW
81.0000 mg | CHEWABLE_TABLET | Freq: Two times a day (BID) | ORAL | Status: DC
  Administered 2021-09-04 – 2021-09-06 (×4): 81 mg via ORAL
  Filled 2021-09-04 (×4): qty 1

## 2021-09-04 MED ORDER — CEFAZOLIN SODIUM-DEXTROSE 2-4 GM/100ML-% IV SOLN
2.0000 g | INTRAVENOUS | Status: AC
  Administered 2021-09-04: 2 g via INTRAVENOUS
  Filled 2021-09-04: qty 100

## 2021-09-04 MED ORDER — METOCLOPRAMIDE HCL 5 MG PO TABS: 5.0000 mg | ORAL_TABLET | Freq: Three times a day (TID) | ORAL | Status: DC | PRN

## 2021-09-04 MED ORDER — CEFAZOLIN SODIUM-DEXTROSE 1-4 GM/50ML-% IV SOLN
1.0000 g | Freq: Four times a day (QID) | INTRAVENOUS | Status: AC
  Administered 2021-09-04 (×2): 1 g via INTRAVENOUS
  Filled 2021-09-04 (×2): qty 50

## 2021-09-04 MED ORDER — OXYCODONE HCL 5 MG PO TABS
5.0000 mg | ORAL_TABLET | ORAL | Status: DC | PRN
  Administered 2021-09-04: 5 mg via ORAL
  Administered 2021-09-06: 10 mg via ORAL
  Filled 2021-09-04: qty 2
  Filled 2021-09-04: qty 1

## 2021-09-04 MED ORDER — METHOCARBAMOL 1000 MG/10ML IJ SOLN
500.0000 mg | Freq: Four times a day (QID) | INTRAVENOUS | Status: DC | PRN
  Filled 2021-09-04: qty 5

## 2021-09-04 MED ORDER — ACETAMINOPHEN 160 MG/5ML PO SOLN: 325.0000 mg | ORAL | Status: DC | PRN

## 2021-09-04 MED ORDER — TRANEXAMIC ACID-NACL 1000-0.7 MG/100ML-% IV SOLN
1000.0000 mg | INTRAVENOUS | Status: AC
  Administered 2021-09-04: 1000 mg via INTRAVENOUS
  Filled 2021-09-04: qty 100

## 2021-09-04 MED ORDER — BUPIVACAINE IN DEXTROSE 0.75-8.25 % IT SOLN
INTRATHECAL | Status: DC | PRN
  Administered 2021-09-04: 1.8 mL via INTRATHECAL

## 2021-09-04 MED ORDER — MEPERIDINE HCL 25 MG/ML IJ SOLN: 6.2500 mg | INTRAMUSCULAR | Status: DC | PRN

## 2021-09-04 MED ORDER — OXYCODONE HCL 5 MG/5ML PO SOLN: 5.0000 mg | Freq: Once | ORAL | Status: DC | PRN

## 2021-09-04 MED ORDER — HYDROMORPHONE HCL 1 MG/ML IJ SOLN
0.5000 mg | INTRAMUSCULAR | Status: DC | PRN
  Administered 2021-09-04 – 2021-09-05 (×3): 1 mg via INTRAVENOUS
  Filled 2021-09-04 (×3): qty 1

## 2021-09-04 MED ORDER — PROPOFOL 10 MG/ML IV BOLUS
INTRAVENOUS | Status: DC | PRN
  Administered 2021-09-04: 30 mg via INTRAVENOUS

## 2021-09-04 SURGICAL SUPPLY — 56 items
BAG COUNTER SPONGE SURGICOUNT (BAG) ×2 IMPLANT
BENZOIN TINCTURE PRP APPL 2/3 (GAUZE/BANDAGES/DRESSINGS) ×1 IMPLANT
BLADE CLIPPER SURG (BLADE) IMPLANT
BLADE SAW SGTL 18X1.27X75 (BLADE) ×2 IMPLANT
COVER SURGICAL LIGHT HANDLE (MISCELLANEOUS) ×2 IMPLANT
CUP ACET PNNCL SECTR W/GRIP 56 (Hips) IMPLANT
DRAPE C-ARM 42X72 X-RAY (DRAPES) ×2 IMPLANT
DRAPE STERI IOBAN 125X83 (DRAPES) ×2 IMPLANT
DRAPE U-SHAPE 47X51 STRL (DRAPES) ×6 IMPLANT
DRSG AQUACEL AG ADV 3.5X10 (GAUZE/BANDAGES/DRESSINGS) ×2 IMPLANT
DURAPREP 26ML APPLICATOR (WOUND CARE) ×2 IMPLANT
ELECT BLADE 4.0 EZ CLEAN MEGAD (MISCELLANEOUS) ×2
ELECT BLADE 6.5 EXT (BLADE) IMPLANT
ELECT REM PT RETURN 9FT ADLT (ELECTROSURGICAL) ×2
ELECTRODE BLDE 4.0 EZ CLN MEGD (MISCELLANEOUS) ×1 IMPLANT
ELECTRODE REM PT RTRN 9FT ADLT (ELECTROSURGICAL) ×1 IMPLANT
FACESHIELD WRAPAROUND (MASK) ×4 IMPLANT
FACESHIELD WRAPAROUND OR TEAM (MASK) ×2 IMPLANT
FEM STEM 12/14 TAPER SZ 4 HIP (Orthopedic Implant) ×2 IMPLANT
FEMORAL STEM 12/14 TPR SZ4 HIP (Orthopedic Implant) IMPLANT
GLOVE BIOGEL PI IND STRL 8 (GLOVE) ×2 IMPLANT
GLOVE BIOGEL PI INDICATOR 8 (GLOVE) ×2
GLOVE ECLIPSE 8.0 STRL XLNG CF (GLOVE) ×2 IMPLANT
GLOVE ORTHO TXT STRL SZ7.5 (GLOVE) ×4 IMPLANT
GOWN STRL REUS W/ TWL LRG LVL3 (GOWN DISPOSABLE) ×2 IMPLANT
GOWN STRL REUS W/ TWL XL LVL3 (GOWN DISPOSABLE) ×2 IMPLANT
GOWN STRL REUS W/TWL LRG LVL3 (GOWN DISPOSABLE) ×2
GOWN STRL REUS W/TWL XL LVL3 (GOWN DISPOSABLE) ×2
HANDPIECE INTERPULSE COAX TIP (DISPOSABLE) ×1
HEAD CERAMIC DELTA 36 PLUS 1.5 (Hips) ×1 IMPLANT
KIT BASIN OR (CUSTOM PROCEDURE TRAY) ×2 IMPLANT
KIT TURNOVER KIT B (KITS) ×2 IMPLANT
MANIFOLD NEPTUNE II (INSTRUMENTS) ×2 IMPLANT
NS IRRIG 1000ML POUR BTL (IV SOLUTION) ×2 IMPLANT
PACK TOTAL JOINT (CUSTOM PROCEDURE TRAY) ×2 IMPLANT
PAD ARMBOARD 7.5X6 YLW CONV (MISCELLANEOUS) ×2 IMPLANT
PINN SECTOR W/GRIP ACE CUP 56 (Hips) ×2 IMPLANT
PINNACLE ALTRX PLUS 4 N 36X56 (Hips) ×1 IMPLANT
SET HNDPC FAN SPRY TIP SCT (DISPOSABLE) ×1 IMPLANT
SPONGE T-LAP 18X18 ~~LOC~~+RFID (SPONGE) ×3 IMPLANT
STAPLER VISISTAT 35W (STAPLE) ×1 IMPLANT
STRIP CLOSURE SKIN 1/2X4 (GAUZE/BANDAGES/DRESSINGS) ×2 IMPLANT
SUT ETHIBOND NAB CT1 #1 30IN (SUTURE) ×2 IMPLANT
SUT MNCRL AB 4-0 PS2 18 (SUTURE) IMPLANT
SUT VIC AB 0 CT1 27 (SUTURE) ×1
SUT VIC AB 0 CT1 27XBRD ANBCTR (SUTURE) ×1 IMPLANT
SUT VIC AB 1 CT1 27 (SUTURE) ×1
SUT VIC AB 1 CT1 27XBRD ANBCTR (SUTURE) ×1 IMPLANT
SUT VIC AB 2-0 CT1 27 (SUTURE) ×1
SUT VIC AB 2-0 CT1 TAPERPNT 27 (SUTURE) ×1 IMPLANT
TOWEL GREEN STERILE (TOWEL DISPOSABLE) ×2 IMPLANT
TOWEL GREEN STERILE FF (TOWEL DISPOSABLE) ×2 IMPLANT
TRAY CATH 16FR W/PLASTIC CATH (SET/KITS/TRAYS/PACK) IMPLANT
TRAY FOLEY W/BAG SLVR 16FR (SET/KITS/TRAYS/PACK) ×1
TRAY FOLEY W/BAG SLVR 16FR ST (SET/KITS/TRAYS/PACK) IMPLANT
WATER STERILE IRR 1000ML POUR (IV SOLUTION) ×4 IMPLANT
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
## 2021-09-04 NOTE — Interval H&P Note (Signed)
History and Physical Interval Note: The patient understands that he is here today for a right hip replacement to treat his right hip osteoarthritis.  There has been no acute or interval change in his medical status.  Please see H&P.  The risks and benefits of surgery have been explained in detail and informed consent is obtained.  The right operative hip has been marked. ? ?09/04/2021 ?9:09 AM ? ?Aaron Frey  has presented today for surgery, with the diagnosis of RIGHT HIP OSTEOARTHRITIS / DEGENERATIVE JOINT DISEASE.  The various methods of treatment have been discussed with the patient and family. After consideration of risks, benefits and other options for treatment, the patient has consented to  Procedure(s) with comments: ?RIGHT TOTAL HIP ARTHROPLASTY ANTERIOR APPROACH (Right) - RNFA as a surgical intervention.  The patient's history has been reviewed, patient examined, no change in status, stable for surgery.  I have reviewed the patient's chart and labs.  Questions were answered to the patient's satisfaction.   ? ? ?Kathryne Hitch ? ? ?

## 2021-09-04 NOTE — Op Note (Signed)
NAME: Aaron Frey, Aaron Frey ?MEDICAL RECORD NO: [id number] ?ACCOUNT NO: [id number] ?DATE OF BIRTH: July 25, 1960 ?FACILITY: [facility] ?LOCATION: [id number] ?PHYSICIAN: Vanita Panda. Magnus Ivan, MD ? ?Operative Report  ? ?DATE OF PROCEDURE: 09/04/2021 ? ?PREOPERATIVE DIAGNOSIS:  Primary osteoarthritis and degenerative joint disease, right hip. ? ?POSTOPERATIVE DIAGNOSIS:  Primary osteoarthritis and degenerative joint disease, right hip. ? ?PROCEDURE:  Right total hip arthroplasty through direct anterior approach. ? ?IMPLANTS:  DePuy sector Gription acetabular component size 56, size 36+4 neutral polyethylene liner, size 4 ACTIS femoral component with high offset, size 36+1 ceramic hip ball. ? ?SURGEON:  Vanita Panda. Magnus Ivan, MD ? ?ASSISTANT:  RNFA. ? ?ANTIBIOTICS:  2 g IV Ancef. ? ?ESTIMATED BLOOD LOSS:  150 mL ? ?COMPLICATIONS:  None. ? ?INDICATIONS:  The patient is a 61 year old gentleman with well-documented worsening arthritis of his right hip.  He does hurt on a daily basis and his pain can be 10/10.  At this point, it is detrimentally affecting his mobility, his quality of life and  ?his activities of daily living.  His x-rays do show severe arthritis of the right hip. Given the failure of conservative treatment we have recommended hip replacement surgery.  He agrees with this as well.  We talked in length and detail about the risk  ?of acute blood loss anemia, nerve or vessel injury, fracture, infection, dislocation, DVT, implant failure, leg length differences and skin and soft tissue issues.  We talked about our goals being decreased pain, improved mobility and overall improved  ?quality of life. ? ?DESCRIPTION OF PROCEDURE:  After informed consent was obtained, appropriate right hip was marked.  He was brought to the operating room and sat up on the stretcher where spinal anesthesia was obtained.  He was laid in supine position on the stretcher.   ?Foley catheter was placed and traction boots were placed on both his feet.   Next, he was placed supine on the Hana fracture table, the perineal post in place and both legs in line with skeletal traction devices.  No traction applied.  His right operative ? hip was prepped and draped in DuraPrep and sterile drapes.  A timeout was called and he was identified as correct patient, correct right hip.  I then made an incision just inferior and posterior to the anterior superior iliac spine and carried this  ?obliquely down the leg.  We dissected down tensor fascia lata muscle.  Tensor fascia was then divided longitudinally to proceed with direct anterior approach to the hip.  I identified and cauterized circumflex vessels and then identified the hip capsule, ? I opened the hip capsule in L-type format finding a moderate joint effusion.  I then placed curved retractors around the medial and lateral femoral neck and made a femoral neck cut with an oscillating saw just proximal to the lesser trochanter and  ?completed this with an osteotome and placed a corkscrew guide in the femoral head and removed the femoral head in its entirety and found a wide area devoid of cartilage.  I then placed a bent Hohmann over the medial acetabular rim and removed remnants of ? acetabular labrum and other debris.  I then began reaming under direct visualization from a size 43 reamer in a stepwise increments going up to a size 55 reamer with all reamers placed under direct visualization last reamer was also placed under direct  ?fluoroscopy, so we could obtain our depth of reaming, our inclination and anteversion.  I then placed real DePuy sector Gription acetabular component size 56  space. Based on his anatomy, we went with a 36+4 polyethylene liner.  Attention was then turned  ?to the femur.  With the leg externally rotated to 120 degrees and extended and adducted we were able to place a Mueller retractor medially and Hohman retractor behind the greater trochanter.  We released lateral joint capsule and used a box  cutting  ?osteotome to enter the femoral canal and a rongeur to lateralize, I then began broaching using the ACTIS broaching system from a size 0 going to a size 4.  With a size 4 in place, we trialed a high offset femoral neck and a 36+1 trial hip ball.  We  ?brought the leg back over and up and with traction and internal rotation we reduced the pelvis.  I did give him more offset.  We were pleased with his range of motion and stability and leg length.  We dislocated the hip, removed the trial components.  We ? went with the real high offset femoral component, which was ACTIS size 4 and the real 36+1 ceramic hip ball and again reduced this in the acetabulum.  We were pleased with leg length, offset, range of motion and stability assessed mechanically and  ?radiographically.  We then irrigated the soft tissue with normal saline solution using pulsatile lavage.  The joint capsule was closed with interrupted #1 Ethibond suture followed by #1 Vicryl to close the tensor fascia.  0 Vicryl was used to close the  ?deep tissue and 2-0 Vicryl was used to close the subcutaneous tissue.  The skin was closed with staples.  Aquacel dressing was applied.  He was taken off the Hana table and taken to recovery room in stable condition with all final counts being correct  ?and no complications were noted. ? ? ?PUS ?D: 09/04/2021 11:00:38 am T: 09/04/2021 2:29:00 pm  ?JOB: [id number]/ [id number]  ?

## 2021-09-04 NOTE — Anesthesia Procedure Notes (Signed)
Procedure Name: Bee ?Date/Time: 09/04/2021 9:50 AM ?Performed by: Jenne Campus, CRNA ?Pre-anesthesia Checklist: Patient identified, Emergency Drugs available, Suction available and Patient being monitored ?Oxygen Delivery Method: Simple face mask ? ? ? ? ?

## 2021-09-04 NOTE — Anesthesia Procedure Notes (Signed)
Spinal ? ?Patient location during procedure: OR ?Start time: 09/04/2021 9:56 AM ?End time: 09/04/2021 10:00 AM ?Reason for block: surgical anesthesia ?Staffing ?Anesthesiologist: Bethena Midget, MD ?Preanesthetic Checklist ?Completed: patient identified, IV checked, site marked, risks and benefits discussed, surgical consent, monitors and equipment checked, pre-op evaluation and timeout performed ?Spinal Block ?Patient position: sitting ?Prep: DuraPrep ?Patient monitoring: heart rate, cardiac monitor, continuous pulse ox and blood pressure ?Approach: midline ?Location: L3-4 ?Injection technique: single-shot ?Needle ?Needle type: Sprotte  ?Needle gauge: 24 G ?Needle length: 9 cm ?Assessment ?Sensory level: T4 ?Events: CSF return ? ? ? ?

## 2021-09-04 NOTE — Transfer of Care (Signed)
Immediate Anesthesia Transfer of Care Note ? ?Patient: Aaron Frey ? ?Procedure(s) Performed: RIGHT TOTAL HIP ARTHROPLASTY (Right: Hip) ? ?Patient Location: PACU ? ?Anesthesia Type:MAC and Spinal ? ?Level of Consciousness: oriented, drowsy and patient cooperative ? ?Airway & Oxygen Therapy: Patient Spontanous Breathing ? ?Post-op Assessment: Report given to RN and Post -op Vital signs reviewed and stable ? ?Post vital signs: Reviewed ? ?Last Vitals:  ?Vitals Value Taken Time  ?BP 92/62 09/04/21 1129  ?Temp    ?Pulse    ?Resp 17 09/04/21 1131  ?SpO2    ?Vitals shown include unvalidated device data. ? ?Last Pain:  ?Vitals:  ? 09/04/21 0813  ?TempSrc:   ?PainSc: 0-No pain  ?   ? ?  ? ?Complications: No notable events documented. ?

## 2021-09-04 NOTE — Anesthesia Preprocedure Evaluation (Addendum)
Anesthesia Evaluation  ?Patient identified by MRN, date of birth, ID band ?Patient awake ? ? ? ?Reviewed: ?Allergy & Precautions, H&P , NPO status , Patient's Chart, lab work & pertinent test results, reviewed documented beta blocker date and time  ? ?Airway ?Mallampati: I ? ?TM Distance: >3 FB ?Neck ROM: full ? ? ? Dental ?no notable dental hx. ?(+) Teeth Intact, Dental Advisory Given ?  ?Pulmonary ?neg pulmonary ROS, former smoker,  ?  ?Pulmonary exam normal ?breath sounds clear to auscultation ? ? ? ? ? ? Cardiovascular ?Exercise Tolerance: Good ?hypertension, Pt. on medications ?negative cardio ROS ? ? ?Rhythm:regular Rate:Normal ? ? ?  ?Neuro/Psych ? Headaches, negative psych ROS  ? GI/Hepatic ?negative GI ROS, Neg liver ROS,   ?Endo/Other  ?Morbid obesity ? Renal/GU ?negative Renal ROS  ?negative genitourinary ?  ?Musculoskeletal ? ?(+) Arthritis , Osteoarthritis,   ? Abdominal ?  ?Peds ? Hematology ?negative hematology ROS ?(+)   ?Anesthesia Other Findings ? ? Reproductive/Obstetrics ?negative OB ROS ? ?  ? ? ? ? ? ? ? ? ? ? ? ? ? ?  ?  ? ? ? ? ? ? ? ?Anesthesia Physical ?Anesthesia Plan ? ?ASA: 3 ? ?Anesthesia Plan: MAC and Spinal  ? ?Post-op Pain Management: Minimal or no pain anticipated  ? ?Induction: Intravenous ? ?PONV Risk Score and Plan: 1 and Propofol infusion ? ?Airway Management Planned: Natural Airway, Nasal Cannula and Simple Face Mask ? ?Additional Equipment: None ? ?Intra-op Plan:  ? ?Post-operative Plan:  ? ?Informed Consent: I have reviewed the patients History and Physical, chart, labs and discussed the procedure including the risks, benefits and alternatives for the proposed anesthesia with the patient or authorized representative who has indicated his/her understanding and acceptance.  ? ? ? ?Dental Advisory Given ? ?Plan Discussed with: CRNA and Anesthesiologist ? ?Anesthesia Plan Comments:   ? ? ? ? ? ? ?Anesthesia Quick Evaluation ? ?
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
## 2021-09-04 NOTE — Brief Op Note (Signed)
09/04/2021 ? ?11:05 AM ? ?PATIENT:  Aaron Frey  61 y.o. male ? ?PRE-OPERATIVE DIAGNOSIS:  RIGHT HIP OSTEOARTHRITIS / DEGENERATIVE JOINT DISEASE ? ?POST-OPERATIVE DIAGNOSIS:  RIGHT HIP OSTEOARTHRITIS / DEGENERATIVE JOINT  ? ?PROCEDURE:  Procedure(s) with comments: ?RIGHT TOTAL HIP ARTHROPLASTY (Right) - RNFA ? ?SURGEON:  Surgeon(s) and Role: ?   Kathryne Hitch, MD - Primary ? ?ANESTHESIA:   spinal ? ?EBL:  150 mL  ? ?COUNTS:  YES ? ?DICTATION: .Other Dictation: Dictation Number [id number] ? ?PLAN OF CARE: Admit for overnight observation ? ?PATIENT DISPOSITION:  PACU - hemodynamically stable. ?  ?Delay start of Pharmacological VTE agent (>24hrs) due to surgical blood loss or risk of bleeding: no ? ?

## 2021-09-05 ENCOUNTER — Encounter (HOSPITAL_COMMUNITY): Payer: Self-pay | Admitting: Orthopaedic Surgery

## 2021-09-05 ENCOUNTER — Telehealth: Payer: Self-pay | Admitting: Orthopaedic Surgery

## 2021-09-05 DIAGNOSIS — M1611 Unilateral primary osteoarthritis, right hip: Secondary | ICD-10-CM | POA: Diagnosis not present

## 2021-09-05 LAB — CBC
HCT: 36.8 % — ABNORMAL LOW (ref 39.0–52.0)
Hemoglobin: 12.2 g/dL — ABNORMAL LOW (ref 13.0–17.0)
MCH: 28 pg (ref 26.0–34.0)
MCHC: 33.2 g/dL (ref 30.0–36.0)
MCV: 84.4 fL (ref 80.0–100.0)
Platelets: 246 10*3/uL (ref 150–400)
RBC: 4.36 MIL/uL (ref 4.22–5.81)
RDW: 13.2 % (ref 11.5–15.5)
WBC: 14.2 10*3/uL — ABNORMAL HIGH (ref 4.0–10.5)
nRBC: 0 % (ref 0.0–0.2)

## 2021-09-05 LAB — BASIC METABOLIC PANEL
Anion gap: 9 (ref 5–15)
BUN: 17 mg/dL (ref 6–20)
CO2: 23 mmol/L (ref 22–32)
Calcium: 8.8 mg/dL — ABNORMAL LOW (ref 8.9–10.3)
Chloride: 103 mmol/L (ref 98–111)
Creatinine, Ser: 1.59 mg/dL — ABNORMAL HIGH (ref 0.61–1.24)
GFR, Estimated: 50 mL/min — ABNORMAL LOW (ref >60)
Glucose, Bld: 147 mg/dL — ABNORMAL HIGH (ref 70–99)
Potassium: 4.3 mmol/L (ref 3.5–5.1)
Sodium: 135 mmol/L (ref 135–145)

## 2021-09-05 MED ORDER — SENNOSIDES-DOCUSATE SODIUM 8.6-50 MG PO TABS
1.0000 | ORAL_TABLET | Freq: Two times a day (BID) | ORAL | Status: DC | PRN
  Administered 2021-09-05: 1 via ORAL
  Filled 2021-09-05: qty 1

## 2021-09-05 MED ORDER — METHOCARBAMOL 500 MG PO TABS: 500.0000 mg | ORAL_TABLET | Freq: Four times a day (QID) | ORAL | 1 refills | Status: DC | PRN

## 2021-09-05 MED ORDER — CHLORHEXIDINE GLUCONATE CLOTH 2 % EX PADS
6.0000 | MEDICATED_PAD | Freq: Every day | CUTANEOUS | Status: DC
  Administered 2021-09-05: 6 via TOPICAL

## 2021-09-05 MED ORDER — POLYETHYLENE GLYCOL 3350 17 G PO PACK
17.0000 g | PACK | Freq: Every day | ORAL | Status: DC | PRN
  Administered 2021-09-05: 17 g via ORAL
  Filled 2021-09-05: qty 1

## 2021-09-05 MED ORDER — ASPIRIN 81 MG PO CHEW: 81.0000 mg | CHEWABLE_TABLET | Freq: Two times a day (BID) | ORAL | 1 refills | Status: DC

## 2021-09-05 MED ORDER — OXYCODONE HCL 5 MG PO TABS
5.0000 mg | ORAL_TABLET | ORAL | 0 refills | Status: DC | PRN
Start: 2021-09-05 — End: 2021-09-19
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
## 2021-09-05 NOTE — Telephone Encounter (Signed)
Patient Nurse from hospital called in stating he would like verbal orders that he could take a shower at the hospital [facility]). ?
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
## 2021-09-05 NOTE — Progress Notes (Signed)
Subjective: ?1 Day Post-Op Procedure(s) (LRB): ?RIGHT TOTAL HIP ARTHROPLASTY (Right) ?Patient reports pain as moderate.   ? ?Objective: ?Vital signs in last 24 hours: ?Temp:  [97.9 ?F (36.6 ?C)-98.7 ?F (37.1 ?C)] 98.6 ?F (37 ?C) (05/17 0741) ?Pulse Rate:  [54-88] 74 (05/17 0741) ?Resp:  [12-19] 18 (05/17 0741) ?BP: (85-140)/(60-94) 132/67 (05/17 0741) ?SpO2:  [92 %-100 %] 98 % (05/17 0741) ? ?Intake/Output from previous day: ?05/16 0701 - 05/17 0700 ?In: 1000 [I.V.:1000] ?Out: 2550 [Urine:2400; Blood:150] ?Intake/Output this shift: ?No intake/output data recorded. ? ?Recent Labs  ?  09/05/21 ?0447  ?HGB 12.2*  ? ?Recent Labs  ?  09/05/21 ?0447  ?WBC 14.2*  ?RBC 4.36  ?HCT 36.8*  ?PLT 246  ? ?Recent Labs  ?  09/05/21 ?0447  ?NA 135  ?K 4.3  ?CL 103  ?CO2 23  ?BUN 17  ?CREATININE 1.59*  ?GLUCOSE 147*  ?CALCIUM 8.8*  ? ?No results for input(s): LABPT, INR in the last 72 hours. ? ?Sensation intact distally ?Intact pulses distally ?Dorsiflexion/Plantar flexion intact ?Incision: scant drainage ? ? ?Assessment/Plan: ?1 Day Post-Op Procedure(s) (LRB): ?RIGHT TOTAL HIP ARTHROPLASTY (Right) ?Up with therapy ?Discharge home with home health possibly this afternoon. ? ? ? ? ? ?Kathryne Hitch ?09/05/2021, 7:42 AM ? ?

## 2021-09-05 NOTE — Discharge Instructions (Signed)

## 2021-09-05 NOTE — Progress Notes (Signed)
Physical Therapy Treatment ?Patient Details ?Name: Aaron Frey ?MRN: [id number] ?DOB: 1960/05/10 ?Today's Date: 09/05/2021 ? ? ?History of Present Illness Pt is a 61 y/o male s/p R THA, direct anterior. PMH includes cervical fx, lumbar fx, tobacco use, and HTN. ? ?  ?PT Comments  ? ? Pt received OOB in recliner on arrival with increased c/o pain. Pt mod a to come to standing from recliner to power up and steady on rise secondary to increased pain and pt stating his hip and knee feeling stiff.  Pt able to continue ambulation at min guard assist starting with very antalgic gait pattern and progressing to step through with less deviation as session continued. Pt deferring stair training this session secondary to pain and pt stating he is d/cing tomorrow. Educated pt re; importance of continued mobility and suggested pt to MS for continued OOB mobility, pt verbalizing understanding. Plan to initiate stair training in AM. Pt continues to benefit from skilled PT services to progress toward functional mobility goals.    ?Recommendations for follow up therapy are one component of a multi-disciplinary discharge planning process, led by the attending physician.  Recommendations may be updated based on patient status, additional functional criteria and insurance authorization. ? ?Follow Up Recommendations ? Follow physician's recommendations for discharge plan and follow up therapies ?  ?  ?Assistance Recommended at Discharge Intermittent Supervision/Assistance  ?Patient can return home with the following Assist for transportation;Assistance with cooking/housework ?  ?Equipment Recommendations ? None recommended by PT  ?  ?Recommendations for Other Services   ? ? ?  ?Precautions / Restrictions Precautions ?Precautions: Fall ?Restrictions ?Weight Bearing Restrictions: Yes ?RLE Weight Bearing: Weight bearing as tolerated  ?  ? ?Mobility ? Bed Mobility ?Overal bed mobility: Needs Assistance ?Bed Mobility: Supine to Sit ?  ?   ?Supine to sit: Supervision ?  ?  ?General bed mobility comments: Supervision for safety ?  ? ?Transfers ?Overall transfer level: Needs assistance ?Equipment used: Rolling walker (2 wheels) ?Transfers: Sit to/from Stand ?Sit to Stand: Mod assist ?  ?  ?  ?  ?  ?General transfer comment: mod a from recliner secondary to increased pain and ?  ? ?Ambulation/Gait ?Ambulation/Gait assistance: Min guard ?Gait Distance (Feet): 150 Feet ?Assistive device: Rolling walker (2 wheels) ?Gait Pattern/deviations: Step-to pattern, Decreased weight shift to right, Step-through pattern ?Gait velocity: slightly decr ?  ?  ?General Gait Details: Slow very antalgic at start,  Mild unsteadiness noted. Min guard for safety. progressing to step through ? ? ?Stairs ?  ?  ?  ?  ?  ? ? ?Wheelchair Mobility ?  ? ?Modified Rankin (Stroke Patients Only) ?  ? ? ?  ?Balance Overall balance assessment: Needs assistance ?Sitting-balance support: No upper extremity supported, Feet supported ?Sitting balance-Leahy Scale: Good ?  ?  ?Standing balance support: Bilateral upper extremity supported ?Standing balance-Leahy Scale: Poor ?Standing balance comment: Reliant on UE support ?  ?  ?  ?  ?  ?  ?  ?  ?  ?  ?  ?  ? ?  ?Cognition Arousal/Alertness: Awake/alert ?Behavior During Therapy: [facility] for tasks assessed/performed ?Overall Cognitive Status: Within Functional Limits for tasks assessed ?  ?  ?  ?  ?  ?  ?  ?  ?  ?  ?  ?  ?  ?  ?  ?  ?  ?  ?  ? ?  ?Exercises Total Joint Exercises ?Ankle Circles/Pumps: Both, 20 reps ?Quad Sets: Right,  10 reps ?Heel Slides: Right, 10 reps ?Hip ABduction/ADduction: Right, 10 reps, Standing ?Knee Flexion: Right, 10 reps, Standing ?Marching in Standing: Right, 10 reps ?Standing Hip Extension: Right, 10 reps, Standing ? ?  ?General Comments   ?  ?  ? ?Pertinent Vitals/Pain Pain Assessment ?Pain Assessment: Faces ?Faces Pain Scale: Hurts even more ?Pain Location: R hip ?Pain Descriptors / Indicators: Grimacing,  Guarding ?Pain Intervention(s): Limited activity within patient's tolerance, Monitored during session, Repositioned, Patient requesting pain meds-RN notified  ? ? ?Home Living   ?  ?  ?  ?  ?  ?  ?  ?  ?  ?   ?  ?Prior Function    ?  ?  ?   ? ?PT Goals (current goals can now be found in the care plan section) Acute Rehab PT Goals ?Patient Stated Goal: to go home ?PT Goal Formulation: With patient ?Time For Goal Achievement: 09/18/21 ? ?  ?Frequency ? ? ? 7X/week ? ? ? ?  ?PT Plan    ? ? ?Co-evaluation   ?  ?  ?  ?  ? ?  ?AM-PAC PT "6 Clicks" Mobility   ?Outcome Measure ? Help needed turning from your back to your side while in a flat bed without using bedrails?: None ?Help needed moving from lying on your back to sitting on the side of a flat bed without using bedrails?: None ?Help needed moving to and from a bed to a chair (including a wheelchair)?: A Little ?Help needed standing up from a chair using your arms (e.g., wheelchair or bedside chair)?: A Little ?Help needed to walk in hospital room?: A Little ?Help needed climbing 3-5 steps with a railing? : A Little ?6 Click Score: 20 ? ?  ?End of Session Equipment Utilized During Treatment: Gait belt ?Activity Tolerance: Patient limited by pain ?Patient left: in chair;with call bell/phone within reach ?Nurse Communication: Mobility status ?PT Visit Diagnosis: Other abnormalities of gait and mobility (R26.89);Pain ?Pain - Right/Left: Right ?Pain - part of body: Hip ?  ? ? ?Time: 6195-0932 ?PT Time Calculation (min) (ACUTE ONLY): 18 min ? ?Charges:  $Gait Training: 8-22 mins          ?          ? ?Lenora Boys. PTA ?Acute Rehabilitation Services ?Office: [phone] ? ? ?Marlana Salvage Kimberlea Schlag ?09/05/2021, 3:13 PM ? ?

## 2021-09-05 NOTE — Anesthesia Postprocedure Evaluation (Signed)
Anesthesia Post Note ? ?Patient: Aaron Frey ? ?Procedure(s) Performed: RIGHT TOTAL HIP ARTHROPLASTY (Right: Hip) ? ?  ? ?Patient location during evaluation: PACU ?Anesthesia Type: MAC and Spinal ?Level of consciousness: awake and alert ?Pain management: pain level controlled ?Vital Signs Assessment: post-procedure vital signs reviewed and stable ?Respiratory status: spontaneous breathing, nonlabored ventilation, respiratory function stable and patient connected to nasal cannula oxygen ?Cardiovascular status: stable and blood pressure returned to baseline ?Postop Assessment: no apparent nausea or vomiting ?Anesthetic complications: no ? ? ?No notable events documented. ? ?Last Vitals:  ?Vitals:  ? 09/05/21 0533 09/05/21 0741  ?BP: 138/68 132/67  ?Pulse: 77 74  ?Resp: 16 18  ?Temp:  37 ?C  ?SpO2: 95% 98%  ?  ?Last Pain:  ?Vitals:  ? 09/05/21 0741  ?TempSrc: Oral  ?PainSc:   ? ?Pain Goal:   ? ?  ?  ?  ?  ?  ?  ?  ? ?Noe Goyer ? ? ? ? ?

## 2021-09-06 ENCOUNTER — Encounter: Payer: Self-pay | Admitting: *Deleted

## 2021-09-06 DIAGNOSIS — M1611 Unilateral primary osteoarthritis, right hip: Secondary | ICD-10-CM | POA: Diagnosis not present

## 2021-09-06 LAB — TYPE AND SCREEN
ABO/RH(D): A POS
Antibody Screen: POSITIVE
PT AG Type: NEGATIVE
Unit division: 0
Unit division: 0

## 2021-09-06 LAB — BPAM RBC
Blood Product Expiration Date: 202306062359
Blood Product Expiration Date: 202306062359
Unit Type and Rh: 6200
Unit Type and Rh: 6200

## 2021-09-06 NOTE — Progress Notes (Signed)
PT AVS reviewed and pt verbalized understanding of all DC teaching and instructions. Pt has all pt belongings in his possession.  

## 2021-09-06 NOTE — Progress Notes (Signed)
Patient ID: Aaron Frey, male   DOB: 24-Jun-1960, 61 y.o.   MRN: [id number] Vitals stable.  Right operative hip stable.  Mobilizing well.  Can be discharged to home today.

## 2021-09-06 NOTE — Discharge Summary (Signed)
Patient ID: Aaron Frey MRN: [id number] DOB/AGE: 1960/06/24 61 y.o.  Admit date: 09/04/2021 Discharge date: 09/06/2021  Admission Diagnoses:  Principal Problem:   Primary osteoarthritis of right hip Active Problems:   Status post total replacement of right hip   Discharge Diagnoses:  Same  Past Medical History:  Diagnosis Date   C2 cervical fracture (HCC) 12/29/2013   C5 vertebral fracture (HCC) 12/29/2013   Class 1 obesity due to excess calories without serious comorbidity with body mass index (BMI) of 32.0 to 32.9 in adult 08/30/2016   Concussion 12/29/2013   Former tobacco use    Hypertension    Hypertriglyceridemia without hypercholesterolemia 09/03/2016   Lumbar transverse process fracture (HCC) 12/29/2013   Migraine    Sacrum and coccyx fracture (HCC) 12/29/2013    Surgeries: Procedure(s): RIGHT TOTAL HIP ARTHROPLASTY on 09/04/2021   Consultants:   Discharged Condition: Improved  Hospital Course: Aaron Frey is an 61 y.o. male who was admitted 09/04/2021 for operative treatment ofPrimary osteoarthritis of right hip. Patient has severe unremitting pain that affects sleep, daily activities, and work/hobbies. After pre-op clearance the patient was taken to the operating room on 09/04/2021 and underwent  Procedure(s): RIGHT TOTAL HIP ARTHROPLASTY.    Patient was given perioperative antibiotics:  Anti-infectives (From admission, onward)    Start     Dose/Rate Route Frequency Ordered Stop   09/04/21 1600  ceFAZolin (ANCEF) IVPB 1 g/50 mL premix        1 g 100 mL/hr over 30 Minutes Intravenous Every 6 hours 09/04/21 1353 09/04/21 2205   09/04/21 0745  ceFAZolin (ANCEF) IVPB 2g/100 mL premix        2 g 200 mL/hr over 30 Minutes Intravenous On call to O.R. 09/04/21 0744 09/04/21 1007        Patient was given sequential compression devices, early ambulation, and chemoprophylaxis to prevent DVT.  Patient benefited maximally from hospital stay and there were no  complications.    Recent vital signs: Patient Vitals for the past 24 hrs:  BP Temp Temp src Pulse Resp SpO2 Height Weight  09/06/21 0739 (!) 141/79 98.7 F (37.1 C) Oral 82 17 97 % -- --  09/06/21 0533 139/74 99 F (37.2 C) Oral 82 -- 97 % -- --  09/05/21 2019 (!) 148/72 98.8 F (37.1 C) Oral 83 -- 94 % -- --  09/05/21 1400 -- -- -- -- -- -- 6' (1.829 m) 102.7 kg     Recent laboratory studies:  Recent Labs    09/05/21 0447  WBC 14.2*  HGB 12.2*  HCT 36.8*  PLT 246  NA 135  K 4.3  CL 103  CO2 23  BUN 17  CREATININE 1.59*  GLUCOSE 147*  CALCIUM 8.8*     Discharge Medications:   Allergies as of 09/06/2021       Reactions   Atacand [candesartan]    Tingling, not able to  tolerate.        Medication List     TAKE these medications    Ajovy 225 MG/1.5ML Soaj Generic drug: Fremanezumab-vfrm Inject 1.5 mLs into the skin every 30 (thirty) days.   amLODipine 10 MG tablet Commonly known as: NORVASC Take 1 tablet (10 mg total) by mouth daily.   aspirin 81 MG chewable tablet Chew 1 tablet (81 mg total) by mouth 2 (two) times daily.   methocarbamol 500 MG tablet Commonly known as: ROBAXIN Take 1 tablet (500 mg total) by mouth every 6 (six) hours as needed for muscle  spasms.   multivitamin with minerals Tabs tablet Take 1 tablet by mouth in the morning.   naproxen 500 MG tablet Commonly known as: NAPROSYN Take 1 tablet (500 mg total) by mouth 2 (two) times daily with a meal.   oxyCODONE 5 MG immediate release tablet Commonly known as: Oxy IR/ROXICODONE Take 1-2 tablets (5-10 mg total) by mouth every 4 (four) hours as needed for moderate pain (pain score 4-6).   sildenafil 100 MG tablet Commonly known as: Viagra Take 0.5-1 tablets (50-100 mg total) by mouth daily as needed for erectile dysfunction.   Ubrelvy 100 MG Tabs Generic drug: Ubrogepant Take 100 mg by mouth every 2 (two) hours as needed for migraine (max 2 tablets/24 hrs.).                Durable Medical Equipment  (From admission, onward)           Start     Ordered   09/04/21 1354  DME 3 n 1  Once        09/04/21 1353   09/04/21 1354  DME Walker rolling  Once       Question Answer Comment  Walker: With 5 Inch Wheels   Patient needs a walker to treat with the following condition Status post total replacement of right hip      09/04/21 1353            Diagnostic Studies: DG Pelvis Portable  Result Date: 09/04/2021 CLINICAL DATA:  Right hip replacement. EXAM: PORTABLE PELVIS 1-2 VIEWS COMPARISON:  Right hip x-rays dated December 26, 2020. FINDINGS: The right hip demonstrates a total arthroplasty without evidence of hardware failure or complication. There is no fracture or dislocation. The alignment is anatomic. Post-surgical changes noted in the surrounding soft tissues. Unchanged mild left hip osteoarthritis. IMPRESSION: 1. Right total hip arthroplasty without acute postoperative complication. Electronically Signed   By: Obie Dredge M.D.   On: 09/04/2021 12:09   DG C-Arm 1-60 Min-No Report  Result Date: 09/04/2021 Fluoroscopy was utilized by the requesting physician.  No radiographic interpretation.   DG HIP UNILAT WITH PELVIS 1V RIGHT  Result Date: 09/04/2021 CLINICAL DATA:  Right total hip arthroplasty anterior approach EXAM: DG HIP (WITH OR WITHOUT PELVIS) 1V RIGHT; DG C-ARM 1-60 MIN-NO REPORT COMPARISON:  12/26/2020 right hip radiographs FLUOROSCOPY TIME:  Radiation Exposure Index (if provided by the fluoroscopic device): 2.3 mGy FINDINGS: Nondiagnostic spot fluoroscopic intraoperative right hip radiographs demonstrate expected postsurgical changes from right total hip arthroplasty with no evidence of right hip dislocation on these views. IMPRESSION: Intraoperative fluoroscopic guidance for right total hip arthroplasty. Electronically Signed   By: Delbert Phenix M.D.   On: 09/04/2021 11:40    Disposition: Discharge disposition: 01-Home or Self  Care          Follow-up Information     Kathryne Hitch, MD Follow up in 2 week(s).   Specialty: Orthopedic Surgery Contact information: [address] [place] [address] [phone]         [facility] Follow up.   Specialty: Home Health Services Why: Home health services will be provided by [facility] information: [address] [place] [address] [phone]                  Signed: Kathryne Hitch 09/06/2021, 9:15 AM

## 2021-09-06 NOTE — Progress Notes (Signed)
Physical Therapy Treatment Patient Details Name: Aaron Frey MRN: [id number] DOB: 1960/09/02 Today's Date: 09/06/2021   History of Present Illness Pt is a 61 y/o male s/p R THA, direct anterior. PMH includes cervical fx, lumbar fx, tobacco use, and HTN.    PT Comments    Pt received supine and agreeable to session with focus on initiation of stair training for safe d/c to home. Pt supervision with all bed mobility, and min guard from elevated EOB to come to standing. Pt min guard throughout ambulation with slow antalgic start, progressing to step through and noted improvement in mechanics with practice. Pt able to ascend/descend 3 steps x2 trials in therapy gym with min physical assist and max assist for cues. Pt slightly impulsive with decreased safety awareness stating he just needs to do it his way despite max cues for safe ascent. Second trial up backwards with better result, with noted increase in stability and good sequencing, but pt stating no one to assist on return home. He did state he will try to have someone meet him at home to assist. Plan to continue stair negotiation in PM to reinforce education from AM session for safe d/c. Pt continues to benefit from skilled PT services to progress toward functional mobility goals.     Recommendations for follow up therapy are one component of a multi-disciplinary discharge planning process, led by the attending physician.  Recommendations may be updated based on patient status, additional functional criteria and insurance authorization.  Follow Up Recommendations  Follow physician's recommendations for discharge plan and follow up therapies     Assistance Recommended at Discharge Intermittent Supervision/Assistance  Patient can return home with the following Assist for transportation;Assistance with cooking/housework   Equipment Recommendations  None recommended by PT    Recommendations for Other Services       Precautions /  Restrictions Precautions Precautions: Fall Restrictions Weight Bearing Restrictions: Yes RLE Weight Bearing: Weight bearing as tolerated     Mobility  Bed Mobility Overal bed mobility: Needs Assistance Bed Mobility: Supine to Sit     Supine to sit: Supervision     General bed mobility comments: Supervision for safety    Transfers Overall transfer level: Needs assistance Equipment used: Rolling walker (2 wheels) Transfers: Sit to/from Stand Sit to Stand: Min guard, From elevated surface           General transfer comment: min guard from elevated EOB    Ambulation/Gait Ambulation/Gait assistance: Min guard Gait Distance (Feet): 125 Feet Assistive device: Rolling walker (2 wheels) Gait Pattern/deviations: Step-to pattern, Decreased weight shift to right, Step-through pattern Gait velocity: slightly decr     General Gait Details: Slow and very antalgic at start,  Mild unsteadiness noted. Min guard for safety. progressing to step through   Stairs Stairs: Yes Stairs assistance: Min assist, Max assist (max assist for cues) Stair Management: No rails, Step to pattern, Backwards, Forwards, With walker Number of Stairs: 6 General stair comments: up/down 3 stairs x2, one trial with walker sideways as pt stating he has no one to assist at home, max cues for sequencing with pt impulsive and stating he just needs to do it his way, second trial up backwards with better result, pt stating he will try to have someone to be his +2 on home arrival   Wheelchair Mobility    Modified Rankin (Stroke Patients Only)       Balance Overall balance assessment: Needs assistance Sitting-balance support: No upper extremity supported, Feet supported Sitting  balance-Leahy Scale: Good     Standing balance support: Bilateral upper extremity supported Standing balance-Leahy Scale: Poor Standing balance comment: Reliant on UE support                            Cognition  Arousal/Alertness: Awake/alert Behavior During Therapy: WFL for tasks assessed/performed Overall Cognitive Status: Within Functional Limits for tasks assessed                                          Exercises      General Comments General comments (skin integrity, edema, etc.): pt slightly impulsive with poor safety awareness with stair negotiation, max cues for sequencing and safety needed with pt stating he just needs to do it his way, on teach back of stair sequencing pt stating "ill figure it out when i get home" this PTA reinforced sequencing and safety      Pertinent Vitals/Pain Pain Assessment Pain Assessment: Faces Faces Pain Scale: Hurts even more Pain Location: R hip Pain Descriptors / Indicators: Grimacing, Guarding Pain Intervention(s): Limited activity within patient's tolerance, Monitored during session, Repositioned    Home Living                          Prior Function            PT Goals (current goals can now be found in the care plan section) Acute Rehab PT Goals Patient Stated Goal: to go home PT Goal Formulation: With patient Time For Goal Achievement: 09/18/21    Frequency    7X/week      PT Plan      Co-evaluation              AM-PAC PT "6 Clicks" Mobility   Outcome Measure  Help needed turning from your back to your side while in a flat bed without using bedrails?: None Help needed moving from lying on your back to sitting on the side of a flat bed without using bedrails?: None Help needed moving to and from a bed to a chair (including a wheelchair)?: A Little Help needed standing up from a chair using your arms (e.g., wheelchair or bedside chair)?: A Little Help needed to walk in hospital room?: A Little Help needed climbing 3-5 steps with a railing? : A Lot 6 Click Score: 19    End of Session Equipment Utilized During Treatment: Gait belt Activity Tolerance: Patient tolerated treatment well Patient  left: in chair;with call bell/phone within reach Nurse Communication: Mobility status PT Visit Diagnosis: Other abnormalities of gait and mobility (R26.89);Pain Pain - Right/Left: Right Pain - part of body: Hip     Time: 9449-6759 PT Time Calculation (min) (ACUTE ONLY): 27 min  Charges:  $Gait Training: 23-37 mins                    Lamoyne Hessel R. PTA Acute Rehabilitation Services Office: [phone]    Catalina Antigua 09/06/2021, 9:42 AM

## 2021-09-11 ENCOUNTER — Telehealth: Payer: Self-pay

## 2021-09-11 ENCOUNTER — Telehealth: Payer: Self-pay | Admitting: Orthopaedic Surgery

## 2021-09-11 NOTE — Telephone Encounter (Signed)
[name]/[facility] called stating patient is walking with heavy limp all over and on uneven ground, refuses to use walker; driving; going walking around the mall, he just wanted to let you know this

## 2021-09-11 NOTE — Telephone Encounter (Signed)
Received call from pt, checking if we received forms. I advised patient we have not received any forms. He will reach out to [place] and try to have them re-faxed.

## 2021-09-19 ENCOUNTER — Encounter: Payer: Self-pay | Admitting: Orthopaedic Surgery

## 2021-09-19 ENCOUNTER — Ambulatory Visit (INDEPENDENT_AMBULATORY_CARE_PROVIDER_SITE_OTHER): Payer: 59 | Admitting: Orthopaedic Surgery

## 2021-09-19 DIAGNOSIS — Z96641 Presence of right artificial hip joint: Secondary | ICD-10-CM

## 2021-09-19 MED ORDER — OXYCODONE HCL 5 MG PO TABS: 5.0000 mg | ORAL_TABLET | ORAL | 0 refills | Status: DC | PRN

## 2021-09-19 NOTE — Progress Notes (Signed)
The patient is 2 weeks status post a right total hip arthroplasty.  He has been on aspirin daily since surgery.  He said he started having some chest pain last night and does feel slightly short of breath and that his heart may be racing.  He has had no previous history from what I understand of any cardiac issues.  He did have hip replacement surgery and there is always certainly a blood clot risk with that.  His right hip incision does look good.  The staples are removed and Steri-Strips applied.  Both calfs are soft and there is no swelling in the feet and ankle bilaterally.  Clinically he does not appear to be in any acute distress.  We did check his vital signs in the office.  His blood pressure is 150/88.  His heart rate is 84 and his O2 sats are 97% on room air.  I gave the patient reassurance that from an orthopedic standpoint he is looking okay.  I do want him to go to the emergency room since he is reporting some chest pain and shortness of breath however his vital signs looking good gave him reassurance as well and he feels like some of his blood pressure may be up just to the pain he is having.  He said his systolic pressure was in the 130s yesterday.  I did send in some oxycodone for him.  From my standpoint, I will see him back in 4 weeks to see how is doing overall but no x-rays are needed.  He can slowly get back into a workout routine as comfort allows.  Again, he will go to the emergency room due to his chest pain.

## 2021-09-24 ENCOUNTER — Telehealth: Payer: Self-pay | Admitting: General Practice

## 2021-09-24 NOTE — Telephone Encounter (Signed)
Transition Care Management Follow-up Telephone Call Date of discharge and from where: 09/19/21 from [facility] How have you been since you were released from the hospital? Doing a little better. Pain is better.  Any questions or concerns? No  Items Reviewed: Did the pt receive and understand the discharge instructions provided? Yes  Medications obtained and verified? No  Other? No  Any new allergies since your discharge? No  Dietary orders reviewed? No Do you have support at home? Yes   Home Care and Equipment/Supplies: Were home health services ordered? yes If so, what is the name of the agency? Unaware of the name.  Has the agency set up a time to come to the patient's home? Yes; patient has already completed the two weeks. Were any new equipment or medical supplies ordered?  No  Functional Questionnaire: (I = Independent and D = Dependent) ADLs: I  Bathing/Dressing- I  Meal Prep- I  Eating- I  Maintaining continence- I  Transferring/Ambulation- I  Managing Meds- I  Follow up appointments reviewed:  PCP Hospital f/u appt confirmed? No   Specialist Hospital f/u appt confirmed? Yes  Scheduled to see the surgeon on 09/19/21. Are transportation arrangements needed? No  If their condition worsens, is the pt aware to call PCP or go to the Emergency Dept.? Yes Was the patient provided with contact information for the PCP's office or ED? Yes Was to pt encouraged to call back with questions or concerns? Yes

## 2021-10-17 ENCOUNTER — Ambulatory Visit (INDEPENDENT_AMBULATORY_CARE_PROVIDER_SITE_OTHER): Payer: 59 | Admitting: Orthopaedic Surgery

## 2021-10-17 DIAGNOSIS — Z96641 Presence of right artificial hip joint: Secondary | ICD-10-CM

## 2021-10-17 MED ORDER — PREDNISONE 50 MG PO TABS: ORAL_TABLET | ORAL | 0 refills | Status: DC

## 2021-10-17 MED ORDER — HYDROCODONE-ACETAMINOPHEN 7.5-325 MG PO TABS: 1.0000 | ORAL_TABLET | Freq: Four times a day (QID) | ORAL | 0 refills | Status: DC | PRN

## 2021-10-17 NOTE — Progress Notes (Signed)
The patient comes in today at 6 weeks status post a right total hip arthroplasty.  He is reporting good range of motion and strength that is improving and he is getting there but he still having some swelling and pain.  He still walks with a considerable limp.  His right hip has pain with internal and external rotation.  His left hip is also stiff with rotation.  He is having a slow recovery and I think he is going to still get there with time.  I would like to see him back in 4 weeks.  I will put him on prednisone for 5 days to see if this will calm down the inflammation that he is dealing with.  At his next visit I would like to just a standing low view pelvis.  All question concerns were answered and addressed.

## 2021-11-08 ENCOUNTER — Telehealth: Payer: Self-pay

## 2021-11-08 NOTE — Telephone Encounter (Signed)
This request has received a Favorable outcome.  Please note any additional information provided by [facility] at the bottom of your screen. Request Reference Number: [id number]. UBRELVY TAB 100MG  is approved through 11/09/2022. Your patient may now fill this prescription and it will be covered.

## 2021-11-08 NOTE — Telephone Encounter (Signed)
PA for Bernita Raisin has been sent    (Key: [id number])  Your information has been sent to [facility].

## 2021-11-13 ENCOUNTER — Other Ambulatory Visit: Payer: Self-pay | Admitting: Neurology

## 2021-11-13 ENCOUNTER — Encounter: Payer: Self-pay | Admitting: *Deleted

## 2021-11-14 ENCOUNTER — Ambulatory Visit (INDEPENDENT_AMBULATORY_CARE_PROVIDER_SITE_OTHER): Payer: 59 | Admitting: Orthopaedic Surgery

## 2021-11-14 ENCOUNTER — Encounter: Payer: Self-pay | Admitting: Orthopaedic Surgery

## 2021-11-14 ENCOUNTER — Telehealth: Payer: Self-pay | Admitting: Orthopaedic Surgery

## 2021-11-14 ENCOUNTER — Ambulatory Visit (INDEPENDENT_AMBULATORY_CARE_PROVIDER_SITE_OTHER): Payer: 59

## 2021-11-14 DIAGNOSIS — Z96641 Presence of right artificial hip joint: Secondary | ICD-10-CM

## 2021-11-14 NOTE — Progress Notes (Signed)
The patient is now just over 2 months status post a right total hip arthroplasty.  This was performed Sep 04, 2021.  He is starting to feel better in terms of his gait and mobility.  He is working out on an exercise routine in terms of strengthening his legs.  He had a significant limp at his last visit which is definitely lessening.  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  I do not feel comfortable with him returning to that type of work as of yet.  His right operative hip is still somewhat stiff but moves much better than it did at his last visit.  His leg lengths are equal.  His left hip exam is entirely normal.  An AP pelvis and lateral of the right hip shows a well-seated total hip arthroplasty on the right side with no complicating features.  He will continue to increase his activities and work on his exercise routine.  We will see him back in 4 weeks to see if we can have him return to work.  No x-rays are needed.

## 2021-11-14 NOTE — Telephone Encounter (Signed)
[employer] forms received. To [employer]. °

## 2021-11-14 NOTE — Telephone Encounter (Signed)
Approved today Request Reference Number: [id number]. AJOVY INJ 225/1.5 is approved through 11/15/2022. Your patient may now fill this prescription and it will be covered.

## 2021-11-14 NOTE — Telephone Encounter (Signed)
Ajovy PA for continuation completed on Cover My Meds. Key: [id number]. Awaiting determination from [facility].

## 2021-11-19 ENCOUNTER — Telehealth: Payer: Self-pay

## 2021-11-19 NOTE — Telephone Encounter (Signed)
Patient left voice mail inquiring if he is going to need left THA.  [phone]

## 2021-11-19 NOTE — Telephone Encounter (Signed)
Please advise 

## 2021-11-20 ENCOUNTER — Telehealth: Payer: Self-pay | Admitting: Orthopaedic Surgery

## 2021-11-20 NOTE — Telephone Encounter (Signed)
Called and advised pt. He stated understanding  

## 2021-11-20 NOTE — Telephone Encounter (Signed)
Received $25.00 cash, medical records release form/ Forwarding to [employer] today ?

## 2021-12-04 ENCOUNTER — Telehealth: Payer: Self-pay | Admitting: Orthopaedic Surgery

## 2021-12-04 NOTE — Telephone Encounter (Signed)
11/14/21 ov note faxed to [facility] [employer] [phone]

## 2021-12-04 NOTE — Telephone Encounter (Signed)
No, I don't . Sorry.

## 2021-12-04 NOTE — Telephone Encounter (Signed)
I called and talked to the pt. He stated the fmla paperwork is not detail enough as to why he is out. Can we send them Dr. Vevelyn Royals last note?

## 2021-12-04 NOTE — Telephone Encounter (Signed)
Patient called. Says the insurance company needs an updated more detailed note for him. His call back number is [phone] he does not have a fax number for them. Says we should have it.

## 2021-12-12 ENCOUNTER — Ambulatory Visit (INDEPENDENT_AMBULATORY_CARE_PROVIDER_SITE_OTHER): Payer: 59 | Admitting: Orthopaedic Surgery

## 2021-12-12 ENCOUNTER — Encounter: Payer: Self-pay | Admitting: Orthopaedic Surgery

## 2021-12-12 ENCOUNTER — Telehealth: Payer: Self-pay | Admitting: Orthopaedic Surgery

## 2021-12-12 DIAGNOSIS — Z96641 Presence of right artificial hip joint: Secondary | ICD-10-CM | POA: Diagnosis not present

## 2021-12-12 MED ORDER — HYDROCODONE-ACETAMINOPHEN 7.5-325 MG PO TABS: 1.0000 | ORAL_TABLET | Freq: Two times a day (BID) | ORAL | 0 refills | Status: DC | PRN

## 2021-12-12 NOTE — Telephone Encounter (Signed)
[employer] forms received. To [employer]. °

## 2021-12-12 NOTE — Progress Notes (Signed)
The patient is now 14 weeks status post a right total hip arthroplasty.  He says he is getting there and reports better range of motion and strength but he still has occasional pain and difficulty lifting heavy objects which his job requires.  He does have pain when he gets up from a sitting position as well.  He is walking without an assistive ice.  His right operative hip moves smoothly and fluidly.  There is no blocks to rotation.  He will work on strengthening of his right hip.  I will give him a note to return to work without restrictions starting December 31, 2021.  In the interim he will still work on increasing his mobility and strengthening.  I will see him back in 6 months with a standing low AP pelvis and lateral of his right operative hip.

## 2021-12-18 ENCOUNTER — Encounter: Payer: Self-pay | Admitting: Family Medicine

## 2021-12-18 ENCOUNTER — Ambulatory Visit (INDEPENDENT_AMBULATORY_CARE_PROVIDER_SITE_OTHER): Payer: 59 | Admitting: Family Medicine

## 2021-12-18 DIAGNOSIS — G43709 Chronic migraine without aura, not intractable, without status migrainosus: Secondary | ICD-10-CM | POA: Diagnosis not present

## 2021-12-18 DIAGNOSIS — I10 Essential (primary) hypertension: Secondary | ICD-10-CM

## 2021-12-18 NOTE — Progress Notes (Signed)
Aaron Frey - 61 y.o. male MRN [id number]  Date of birth: May 18, 1960  Subjective No chief complaint on file.   HPI Aaron Frey is a 61 year old male here today for follow-up visit.  Continues on amlodipine for management of hypertension.  Blood pressure elevated initially today.  Denies symptoms related to hypertension including chest pain, palpitations, worsening headaches or vision changes.  He has been seeing neurology for treatment of his migraines.  Doing pretty well with combination of Ajovy and Ubrelvy.  He has not noted any side effects from these medications.  He has been receiving FMLA paperwork for his chronic headaches.  He misses work intermittently if abortive medications fail.   ROS:  A comprehensive ROS was completed and negative except as noted per HPI  Allergies  Allergen Reactions   Atacand [Candesartan]     Tingling, not able to  tolerate.    Past Medical History:  Diagnosis Date   C2 cervical fracture (HCC) 12/29/2013   C5 vertebral fracture (HCC) 12/29/2013   Class 1 obesity due to excess calories without serious comorbidity with body mass index (BMI) of 32.0 to 32.9 in adult 08/30/2016   Concussion 12/29/2013   Former tobacco use    Hypertension    Hypertriglyceridemia without hypercholesterolemia 09/03/2016   Lumbar transverse process fracture (HCC) 12/29/2013   Migraine    Sacrum and coccyx fracture (HCC) 12/29/2013    Past Surgical History:  Procedure Laterality Date   ACHILLES TENDON REPAIR     lump removed for chest Right    when he was a child   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   Procedure: RIGHT TOTAL HIP ARTHROPLASTY;  Surgeon: Kathryne Hitch, MD;  Location: [facility] OR;  Service: Orthopedics;  Laterality: Right;  RNFA    Social History   Socioeconomic History   Marital status: Single    Spouse name: Not on file   Number of children: 4   Years of education: Not on file   Highest education level: Not on file  Occupational  History   Not on file  Tobacco Use   Smoking status: Former    Packs/day: 0.10    Types: Cigarettes    Quit date: 2015    Years since quitting: 8.6   Smokeless tobacco: Never   Tobacco comments:    3-4 cigarettes/day  Vaping Use   Vaping Use: Never used  Substance and Sexual Activity   Alcohol use: Yes    Comment: Occasionally- monthly   Drug use: No    Types: Marijuana   Sexual activity: Not on file  Other Topics Concern   Not on file  Social History Narrative   Lives alone   Right handed   Caffeine: unknown    Social Determinants of Health   Financial Resource Strain: Not on file  Food Insecurity: Not on file  Transportation Needs: Not on file  Physical Activity: Not on file  Stress: Not on file  Social Connections: Not on file    Family History  Problem Relation Age of Onset   Cancer Mother    Stroke Mother    Dementia Mother    Migraines Mother    ALS Father    Migraines Daughter    Migraines Son     Health Maintenance  Topic Date Due   COVID-19 Vaccine (1) 01/03/2022 (Originally 07/04/1961)   Zoster Vaccines- Shingrix (2 of 2) 05/23/2022 (Originally 12/02/2016)   INFLUENZA VACCINE  07/21/2022 (Originally 11/20/2021)   HIV Screening  12/19/2022 (Originally 01/03/1978)  TETANUS/TDAP  12/30/2023   Fecal DNA (Cologuard)  07/01/2024   Hepatitis C Screening  Completed   HPV VACCINES  Aged Out     ----------------------------------------------------------------------------------------------------------------------------------------------------------------------------------------------------------------- Physical Exam BP (!) 156/77 (BP Location: Left Arm, Patient Position: Sitting, Cuff Size: Normal)   Pulse 80   Ht 6' (1.829 m)   Wt 223 lb (101.2 kg)   SpO2 97%   BMI 30.24 kg/m   Physical Exam Constitutional:      Appearance: Normal appearance.  Eyes:     General: No scleral icterus. Cardiovascular:     Rate and Rhythm: Normal rate and regular  rhythm.  Musculoskeletal:     Cervical back: Neck supple.  Neurological:     Mental Status: He is alert.  Psychiatric:        Mood and Affect: Mood normal.        Behavior: Behavior normal.     ------------------------------------------------------------------------------------------------------------------------------------------------------------------------------------------------------------------- Assessment and Plan  Migraine Fairly well controlled at this time with combination of Ajovy and Ubrelvy.  Recommend follow-up with neurology.  I will complete his paperwork for intermittent FMLA once received.  Hypertension Blood pressure is elevated however has not taken amlodipine today.  Recommend he take this consistently.  Follow-up in approximately 2 weeks for blood pressure recheck.   No orders of the defined types were placed in this encounter.   Return in about 6 months (around 06/20/2022) for HTN.    This visit occurred during the SARS-CoV-2 public health emergency.  Safety protocols were in place, including screening questions prior to the visit, additional usage of staff PPE, and extensive cleaning of exam room while observing appropriate contact time as indicated for disinfecting solutions.

## 2021-12-18 NOTE — Assessment & Plan Note (Signed)
Fairly well controlled at this time with combination of Ajovy and Ubrelvy.  Recommend follow-up with neurology.  I will complete his paperwork for intermittent FMLA once received.

## 2021-12-18 NOTE — Assessment & Plan Note (Signed)
Blood pressure is elevated however has not taken amlodipine today.  Recommend he take this consistently.  Follow-up in approximately 2 weeks for blood pressure recheck.

## 2021-12-18 NOTE — Patient Instructions (Signed)
Contact neurology for follow up.  See me again in 6 months.

## 2021-12-26 ENCOUNTER — Telehealth: Payer: Self-pay | Admitting: Family Medicine

## 2021-12-26 NOTE — Telephone Encounter (Signed)
Patient dropped off short term disability paperwork for PCP to complete. Patient requested final paperwork be faxed to number provided on paper. Patient was informed of 3-5 day turn around and possible fee. Paperwork was left in provider's box. Aaron Frey

## 2021-12-28 ENCOUNTER — Telehealth: Payer: Self-pay | Admitting: Family Medicine

## 2021-12-28 NOTE — Telephone Encounter (Signed)
Called patient to notify of incorrect forms, to which patient stated they would look into finding the correct forms to drop off at a later date. Aaron Frey

## 2021-12-28 NOTE — Telephone Encounter (Signed)
Form dropped off is form for Dr. Magnus Ivan (ortho).  He will need to have form for primary care sent over or dropped off.

## 2021-12-28 NOTE — Telephone Encounter (Signed)
Patient returned to practice to drop off FMLA paperwork. Patient was informed of 3-5 day turn around and potential fee. Paperwork was left in provider's box. Katha Hamming

## 2022-01-04 NOTE — Telephone Encounter (Signed)
Left voicemail for patient that paperwork is complete and scanned into chart under release of information. Katha Hamming

## 2022-01-04 NOTE — Telephone Encounter (Signed)
Documents completed. Please contact patient concerning pick-up and scan to chart.   Thanks

## 2022-01-08 ENCOUNTER — Ambulatory Visit (INDEPENDENT_AMBULATORY_CARE_PROVIDER_SITE_OTHER): Payer: 59 | Admitting: Adult Health

## 2022-01-08 ENCOUNTER — Encounter: Payer: Self-pay | Admitting: Adult Health

## 2022-01-08 VITALS — BP 147/80 | HR 78 | Ht 72.0 in | Wt 225.2 lb

## 2022-01-08 DIAGNOSIS — G43709 Chronic migraine without aura, not intractable, without status migrainosus: Secondary | ICD-10-CM | POA: Diagnosis not present
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
## 2022-01-13 ENCOUNTER — Other Ambulatory Visit: Payer: Self-pay | Admitting: Neurology

## 2022-01-14 ENCOUNTER — Other Ambulatory Visit: Payer: Self-pay | Admitting: Neurology

## 2022-02-21 ENCOUNTER — Telehealth: Payer: Self-pay | Admitting: Orthopaedic Surgery

## 2022-02-21 NOTE — Telephone Encounter (Signed)
Please copy all hip images to CD from 06/20/21-present. (2Nd MD requestor). Please let me know when ready. Thank you

## 2022-02-22 NOTE — Telephone Encounter (Signed)
Advised 2nd MD CD is ready using their [employer] ex label,advised they need to scheduled [employer] ex pickup. CD at front desk.

## 2022-02-22 NOTE — Telephone Encounter (Signed)
CD is ready! 

## 2022-03-04 ENCOUNTER — Other Ambulatory Visit: Payer: Self-pay | Admitting: Adult Health

## 2022-03-05 ENCOUNTER — Encounter: Payer: Self-pay | Admitting: Family Medicine

## 2022-03-05 ENCOUNTER — Ambulatory Visit (INDEPENDENT_AMBULATORY_CARE_PROVIDER_SITE_OTHER): Payer: 59

## 2022-03-05 ENCOUNTER — Ambulatory Visit (INDEPENDENT_AMBULATORY_CARE_PROVIDER_SITE_OTHER): Payer: 59 | Admitting: Family Medicine

## 2022-03-05 VITALS — BP 147/79 | HR 73 | Ht 72.0 in | Wt 223.0 lb

## 2022-03-05 DIAGNOSIS — M5416 Radiculopathy, lumbar region: Secondary | ICD-10-CM

## 2022-03-05 DIAGNOSIS — M47812 Spondylosis without myelopathy or radiculopathy, cervical region: Secondary | ICD-10-CM

## 2022-03-05 DIAGNOSIS — M545 Low back pain, unspecified: Secondary | ICD-10-CM | POA: Diagnosis not present

## 2022-03-05 MED ORDER — PREDNISONE 10 MG (48) PO TBPK: ORAL_TABLET | ORAL | 0 refills | Status: DC

## 2022-03-05 MED ORDER — METHOCARBAMOL 500 MG PO TABS: 500.0000 mg | ORAL_TABLET | Freq: Four times a day (QID) | ORAL | 1 refills | Status: DC | PRN

## 2022-03-08 ENCOUNTER — Ambulatory Visit: Payer: 59 | Attending: Family Medicine | Admitting: Physical Therapy

## 2022-03-08 ENCOUNTER — Other Ambulatory Visit: Payer: Self-pay

## 2022-03-08 ENCOUNTER — Encounter: Payer: Self-pay | Admitting: Physical Therapy

## 2022-03-08 DIAGNOSIS — M47812 Spondylosis without myelopathy or radiculopathy, cervical region: Secondary | ICD-10-CM | POA: Diagnosis not present

## 2022-03-08 DIAGNOSIS — M542 Cervicalgia: Secondary | ICD-10-CM | POA: Diagnosis not present

## 2022-03-08 DIAGNOSIS — M5459 Other low back pain: Secondary | ICD-10-CM | POA: Insufficient documentation

## 2022-03-08 DIAGNOSIS — R29898 Other symptoms and signs involving the musculoskeletal system: Secondary | ICD-10-CM | POA: Insufficient documentation

## 2022-03-08 DIAGNOSIS — M5416 Radiculopathy, lumbar region: Secondary | ICD-10-CM | POA: Diagnosis not present
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
## 2022-03-10 DIAGNOSIS — M5416 Radiculopathy, lumbar region: Secondary | ICD-10-CM | POA: Insufficient documentation

## 2022-03-10 NOTE — Assessment & Plan Note (Signed)
Adding steroid taper as well as Robaxin.  Recommend starting home exercise program and place referral for formal physical therapy.  X-rays of lumbar spine ordered.

## 2022-03-10 NOTE — Progress Notes (Signed)
Aaron Frey - 61 y.o. male MRN [id number]  Date of birth: October 18, 1960  Subjective Chief Complaint  Patient presents with   Neck Pain   Leg Pain    HPI Aaron Frey is a 61 year old male here today with complaint of continued neck pain as well as pain in his lower back.  Previous imaging of the neck with multilevel degenerative changes.  He does have some radiation into the upper extremities at times.  Denies any weakness of the upper extremities.  Low back pain started a couple weeks ago.  Pain does radiate into the right lower extremity.  Denies weakness, numbness or tingling.  He has not had any bowel or bladder dysfunction.  He has not tried anything so far for this.  ROS:  A comprehensive ROS was completed and negative except as noted per HPI  Allergies  Allergen Reactions   Atacand [Candesartan]     Tingling, not able to  tolerate.    Past Medical History:  Diagnosis Date   C2 cervical fracture (HCC) 12/29/2013   C5 vertebral fracture (HCC) 12/29/2013   Class 1 obesity due to excess calories without serious comorbidity with body mass index (BMI) of 32.0 to 32.9 in adult 08/30/2016   Concussion 12/29/2013   Former tobacco use    Hypertension    Hypertriglyceridemia without hypercholesterolemia 09/03/2016   Lumbar transverse process fracture (HCC) 12/29/2013   Migraine    Sacrum and coccyx fracture (HCC) 12/29/2013    Past Surgical History:  Procedure Laterality Date   ACHILLES TENDON REPAIR     lump removed for chest Right    when he was a child   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   Procedure: RIGHT TOTAL HIP ARTHROPLASTY;  Surgeon: Kathryne Hitch, MD;  Location: [facility] OR;  Service: Orthopedics;  Laterality: Right;  RNFA    Social History   Socioeconomic History   Marital status: Single    Spouse name: Not on file   Number of children: 4   Years of education: Not on file   Highest education level: Not on file  Occupational History   Not on file   Tobacco Use   Smoking status: Former    Packs/day: 0.10    Types: Cigarettes    Quit date: 2015    Years since quitting: 8.8   Smokeless tobacco: Never   Tobacco comments:    3-4 cigarettes/day  Vaping Use   Vaping Use: Never used  Substance and Sexual Activity   Alcohol use: Yes    Comment: Occasionally- monthly   Drug use: No    Types: Marijuana   Sexual activity: Not on file  Other Topics Concern   Not on file  Social History Narrative   Lives alone   Right handed   Caffeine: unknown    Social Determinants of Health   Financial Resource Strain: Not on file  Food Insecurity: Not on file  Transportation Needs: Not on file  Physical Activity: Not on file  Stress: Not on file  Social Connections: Not on file    Family History  Problem Relation Age of Onset   Cancer Mother    Stroke Mother    Dementia Mother    Migraines Mother    ALS Father    Migraines Daughter    Migraines Son     Health Maintenance  Topic Date Due   COVID-19 Vaccine (1) 05/23/2022 (Originally 07/04/1961)   Zoster Vaccines- Shingrix (2 of 2) 05/23/2022 (Originally 12/02/2016)   INFLUENZA VACCINE  07/21/2022 (Originally 11/20/2021)   HIV Screening  12/19/2022 (Originally 01/03/1978)   Fecal DNA (Cologuard)  07/01/2024   Hepatitis C Screening  Completed   HPV VACCINES  Aged Out     ----------------------------------------------------------------------------------------------------------------------------------------------------------------------------------------------------------------- Physical Exam BP (!) 147/79   Pulse 73   Ht 6' (1.829 m)   Wt 223 lb (101.2 kg)   SpO2 97%   BMI 30.24 kg/m   Physical Exam Constitutional:      Appearance: Normal appearance.  HENT:     Head: Normocephalic and atraumatic.  Eyes:     General: No scleral icterus. Neck:     Comments: Pain with full extension. Musculoskeletal:     Comments: Range of motion lumbar spine is normal.  Negative  straight leg raise bilaterally.  No significant gait abnormality.  Neurological:     Mental Status: He is alert.     ------------------------------------------------------------------------------------------------------------------------------------------------------------------------------------------------------------------- Assessment and Plan  Cervical spondylosis He is having increased pain in his neck.  Adding physical therapy back on for neck and low back pain.  Lumbar radiculopathy Adding steroid taper as well as Robaxin.  Recommend starting home exercise program and place referral for formal physical therapy.  X-rays of lumbar spine ordered.   Meds ordered this encounter  Medications   predniSONE (STERAPRED UNI-PAK 48 TAB) 10 MG (48) TBPK tablet    Sig: 12 day taper.  Take as directed on packaging.    Dispense:  48 tablet    Refill:  0   methocarbamol (ROBAXIN) 500 MG tablet    Sig: Take 1 tablet (500 mg total) by mouth every 6 (six) hours as needed for muscle spasms.    Dispense:  40 tablet    Refill:  1    No follow-ups on file.    This visit occurred during the SARS-CoV-2 public health emergency.  Safety protocols were in place, including screening questions prior to the visit, additional usage of staff PPE, and extensive cleaning of exam room while observing appropriate contact time as indicated for disinfecting solutions.

## 2022-03-10 NOTE — Assessment & Plan Note (Signed)
He is having increased pain in his neck.  Adding physical therapy back on for neck and low back pain.

## 2022-03-11 ENCOUNTER — Ambulatory Visit: Payer: 59 | Admitting: Physical Therapy

## 2022-03-11 ENCOUNTER — Encounter: Payer: Self-pay | Admitting: Physical Therapy

## 2022-03-11 DIAGNOSIS — M5459 Other low back pain: Secondary | ICD-10-CM

## 2022-03-11 DIAGNOSIS — M47812 Spondylosis without myelopathy or radiculopathy, cervical region: Secondary | ICD-10-CM | POA: Diagnosis not present

## 2022-03-11 DIAGNOSIS — M542 Cervicalgia: Secondary | ICD-10-CM

## 2022-03-11 DIAGNOSIS — R29898 Other symptoms and signs involving the musculoskeletal system: Secondary | ICD-10-CM
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
## 2022-03-19 ENCOUNTER — Ambulatory Visit: Payer: 59 | Admitting: Physical Therapy

## 2022-03-21 ENCOUNTER — Ambulatory Visit: Payer: 59 | Admitting: Physical Therapy

## 2022-03-26 ENCOUNTER — Ambulatory Visit: Payer: 59 | Attending: Family Medicine | Admitting: Physical Therapy

## 2022-03-26 ENCOUNTER — Encounter: Payer: Self-pay | Admitting: Physical Therapy

## 2022-03-26 DIAGNOSIS — R29898 Other symptoms and signs involving the musculoskeletal system: Secondary | ICD-10-CM | POA: Insufficient documentation

## 2022-03-26 DIAGNOSIS — M542 Cervicalgia: Secondary | ICD-10-CM | POA: Insufficient documentation

## 2022-03-26 DIAGNOSIS — M5459 Other low back pain: Secondary | ICD-10-CM | POA: Diagnosis present
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
## 2022-03-28 ENCOUNTER — Encounter: Payer: Self-pay | Admitting: Physical Therapy

## 2022-03-28 ENCOUNTER — Ambulatory Visit: Payer: 59 | Admitting: Physical Therapy

## 2022-03-28 DIAGNOSIS — M5459 Other low back pain: Secondary | ICD-10-CM

## 2022-03-28 DIAGNOSIS — M542 Cervicalgia: Secondary | ICD-10-CM

## 2022-03-28 DIAGNOSIS — R29898 Other symptoms and signs involving the musculoskeletal system: Secondary | ICD-10-CM | POA: Diagnosis not present

## 2022-05-08 ENCOUNTER — Other Ambulatory Visit: Payer: Self-pay | Admitting: Family Medicine

## 2022-06-10 ENCOUNTER — Other Ambulatory Visit: Payer: Self-pay | Admitting: Pharmacist

## 2022-06-10 NOTE — Progress Notes (Signed)
Patient appearing on report for [employer] - Hypertension Control report due to last documented ambulatory blood pressure of 147/79 on 03/05/22. Next appointment with PCP is 06/24/22   Outreached patient to discuss hypertension control and medication management.   Current antihypertensives: amlodipine 38m daily   Patient has an automated upper arm home BP machine.  Current blood pressure readings: 10000000systolic   Patient denies hypotensive signs and symptoms including dizziness, lightheadedness.  Patient denies hypertensive symptoms including headache, chest pain, shortness of breath.   Assessment/Plan: - Currently uncontrolled - - Reviewed goal blood pressure <140/90 - Reviewed appropriate administration of medication regimen - Reviewed appropriate home BP monitoring technique (avoid caffeine, smoking, and exercise for 30 minutes before checking, rest for at least 5 minutes before taking BP, sit with feet flat on the floor and back against a hard surface, uncross legs, and rest arm on flat surface) - Discussed dietary modifications, such as reduced salt intake, focus on whole grains, vegetables, lean proteins - Discussed goal of 150 minutes of moderate intensity physical activity weekly - Recommend patient continue with lifestyle modifications, and patient requested cancellation of upcoming PCP appointment at this time. Completed this, but recommended he call back to office for reschedule when he is ready.  KLarinda Buttery PharmD Clinical Pharmacist [phone]

## 2022-06-10 NOTE — Patient Instructions (Addendum)
Tollie,  Thank you for speaking with me today. As discussed, please be sure to check your blood pressure at home so we can have the best possible control, which will lower risks of heart attack and stroke.  Below are some helpful tips about checking blood pressure:  Check your blood pressure twice weekly, and any time you have concerning symptoms like headache, chest pain, dizziness, shortness of breath, or vision changes.   Our goal is less than 140/90.  To appropriately check your blood pressure, make sure you do the following:  1) Avoid caffeine, exercise, or tobacco products for 30 minutes before checking. Empty your bladder. 2) Sit with your back supported in a flat-backed chair. Rest your arm on something flat (arm of the chair, table, etc). 3) Sit still with your feet flat on the floor, resting, for at least 5 minutes.  4) Check your blood pressure. Take 1-2 readings.  5) Write down these readings and bring with you to any provider appointments.  Bring your home blood pressure machine with you to a provider's office for accuracy comparison at least once a year.   Make sure you take your blood pressure medications before you come to any office visit, even if you were asked to fast for labs.  Take care, Luana Shu, PharmD Clinical Pharmacist [facility] [phone]

## 2022-06-12 ENCOUNTER — Other Ambulatory Visit: Payer: Self-pay

## 2022-06-12 MED ORDER — AMLODIPINE BESYLATE 10 MG PO TABS: 10.0000 mg | ORAL_TABLET | Freq: Every day | ORAL | 0 refills | Status: DC

## 2022-06-12 NOTE — Telephone Encounter (Signed)
Lvm for patient to call back to schedule a appointment for a HTN with Dr. Zigmund Daniel. tvt

## 2022-06-12 NOTE — Telephone Encounter (Signed)
Please contact patient to schedule HTN appt with Dr. Zigmund Daniel. Sending 90 day medication refill. Thanks

## 2022-06-17 ENCOUNTER — Ambulatory Visit: Payer: 59 | Admitting: Orthopaedic Surgery

## 2022-06-24 ENCOUNTER — Ambulatory Visit: Payer: 59 | Admitting: Family Medicine

## 2022-06-25 ENCOUNTER — Encounter: Payer: Self-pay | Admitting: Family Medicine

## 2022-06-25 ENCOUNTER — Ambulatory Visit (INDEPENDENT_AMBULATORY_CARE_PROVIDER_SITE_OTHER): Payer: 59 | Admitting: Family Medicine

## 2022-06-25 VITALS — BP 134/74 | HR 82 | Ht 72.0 in | Wt 231.0 lb

## 2022-06-25 DIAGNOSIS — I10 Essential (primary) hypertension: Secondary | ICD-10-CM | POA: Diagnosis not present

## 2022-06-25 DIAGNOSIS — M47812 Spondylosis without myelopathy or radiculopathy, cervical region: Secondary | ICD-10-CM | POA: Diagnosis not present

## 2022-06-25 DIAGNOSIS — Z23 Encounter for immunization: Secondary | ICD-10-CM

## 2022-06-25 DIAGNOSIS — G43709 Chronic migraine without aura, not intractable, without status migrainosus: Secondary | ICD-10-CM | POA: Diagnosis not present

## 2022-06-25 MED ORDER — METHOCARBAMOL 500 MG PO TABS: ORAL_TABLET | ORAL | 1 refills | Status: DC

## 2022-06-25 MED ORDER — GABAPENTIN 300 MG PO CAPS: 300.0000 mg | ORAL_CAPSULE | Freq: Every day | ORAL | 1 refills | Status: DC

## 2022-06-25 NOTE — Assessment & Plan Note (Signed)
He is gabapentin as needed for radicular symptoms.

## 2022-06-25 NOTE — Assessment & Plan Note (Signed)
Blood pressure is well-controlled at this time.  Recommend continuation of current medications for management of hypertension.  Low-sodium diet encouraged.

## 2022-06-25 NOTE — Assessment & Plan Note (Signed)
Management per neurology.  Stable with current medications.

## 2022-06-25 NOTE — Progress Notes (Signed)
Aaron Frey - 62 y.o. male MRN [place]:[id number]  Date of birth: 07-13-1960  Subjective Chief Complaint  Patient presents with   Hypertension    HPI Aaron Frey is a 62 year old male here today for follow-up visit.  Reports he is doing pretty well.  Continues on amlodipine for management of hypertension.  Doing well with this at current strength.  Blood pressure is well-controlled today.  Has not had any significant side effects from medication at current strength.  Denies chest pain, shortness of breath, palpitations,  or vision changes.  History of migraines.  He does see neurology for management of this.  Current treatment with Ajovy monthly with Ubrelvy as needed for breakthrough.  Continues with gabapentin as needed for lumbar and cervical radicular symptoms.  ROS:  A comprehensive ROS was completed and negative except as noted per HPI    Allergies  Allergen Reactions   Atacand [Candesartan]     Tingling, not able to  tolerate.    Past Medical History:  Diagnosis Date   C2 cervical fracture ([place]) 12/29/2013   C5 vertebral fracture ([employer]) 12/29/2013   Class 1 obesity due to excess calories without serious comorbidity with body mass index (BMI) of 32.0 to 32.9 in adult 08/30/2016   Concussion 12/29/2013   Former tobacco use    Hypertension    Hypertriglyceridemia without hypercholesterolemia 09/03/2016   Lumbar transverse process fracture ([place]) 12/29/2013   Migraine    Sacrum and coccyx fracture ([place]) 12/29/2013    Past Surgical History:  Procedure Laterality Date   ACHILLES TENDON REPAIR     lump removed for chest Right    when he was a child   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   Procedure: RIGHT TOTAL HIP ARTHROPLASTY;  Surgeon: Mcarthur Rossetti, MD;  Location: [facility];  Service: Orthopedics;  Laterality: Right;  RNFA    Social History   Socioeconomic History   Marital status: Single    Spouse name: Not on file   Number of children: 4   Years of  education: Not on file   Highest education level: Not on file  Occupational History   Not on file  Tobacco Use   Smoking status: Former    Packs/day: 0.10    Types: Cigarettes    Quit date: 2015    Years since quitting: 9.1   Smokeless tobacco: Never   Tobacco comments:    3-4 cigarettes/day  Vaping Use   Vaping Use: Never used  Substance and Sexual Activity   Alcohol use: Yes    Comment: Occasionally- monthly   Drug use: No    Types: Marijuana   Sexual activity: Not on file  Other Topics Concern   Not on file  Social History Narrative   Lives alone   Right handed   Caffeine: unknown    Social Determinants of Health   Financial Resource Strain: Not on file  Food Insecurity: Not on file  Transportation Needs: Not on file  Physical Activity: Not on file  Stress: Not on file  Social Connections: Not on file    Family History  Problem Relation Age of Onset   Cancer Mother    Stroke Mother    Dementia Mother    Migraines Mother    ALS Father    Migraines Daughter    Migraines Son     Health Maintenance  Topic Date Due   INFLUENZA VACCINE  07/21/2022 (Originally 11/20/2021)   Zoster Vaccines- Shingrix (2 of 2) 09/25/2022 (Originally 12/02/2016)  HIV Screening  12/19/2022 (Originally 01/03/1978)   COVID-19 Vaccine (1) 07/11/2023 (Originally 07/04/1961)   DTaP/Tdap/Td (2 - Td or Tdap) 12/30/2023   Fecal DNA (Cologuard)  07/01/2024   Hepatitis C Screening  Completed   HPV VACCINES  Aged Out     ----------------------------------------------------------------------------------------------------------------------------------------------------------------------------------------------------------------- Physical Exam BP 134/74 (BP Location: Right Arm, Patient Position: Sitting, Cuff Size: Large)   Pulse 82   Ht 6' (1.829 m)   Wt 231 lb (104.8 kg)   SpO2 95%   BMI 31.33 kg/m   Physical Exam Constitutional:      Appearance: Normal appearance.  HENT:     Head:  Normocephalic and atraumatic.  Eyes:     General: No scleral icterus. Cardiovascular:     Rate and Rhythm: Normal rate and regular rhythm.  Pulmonary:     Effort: Pulmonary effort is normal.     Breath sounds: Normal breath sounds.  Musculoskeletal:     Cervical back: Neck supple.  Neurological:     Mental Status: He is alert.  Psychiatric:        Mood and Affect: Mood normal.        Behavior: Behavior normal.     ------------------------------------------------------------------------------------------------------------------------------------------------------------------------------------------------------------------- Assessment and Plan  Hypertension Blood pressure is well-controlled at this time.  Recommend continuation of current medications for management of hypertension.  Low-sodium diet encouraged.  Cervical spondylosis He is gabapentin as needed for radicular symptoms.  Migraine Management per neurology.  Stable with current medications.   Meds ordered this encounter  Medications   gabapentin (NEURONTIN) 300 MG capsule    Sig: Take 1 capsule (300 mg total) by mouth daily.    Dispense:  90 capsule    Refill:  1   methocarbamol (ROBAXIN) 500 MG tablet    Sig: TAKE 1 TABLET(500 MG) BY MOUTH EVERY 6 HOURS AS NEEDED FOR MUSCLE SPASMS    Dispense:  40 tablet    Refill:  1    Return in about 6 months (around 12/26/2022) for HTN.    This visit occurred during the SARS-CoV-2 public health emergency.  Safety protocols were in place, including screening questions prior to the visit, additional usage of staff PPE, and extensive cleaning of exam room while observing appropriate cont labs at this time act time as indicated for disinfecting solutions.

## 2022-06-27 ENCOUNTER — Encounter: Payer: Self-pay | Admitting: Radiology

## 2022-07-10 ENCOUNTER — Other Ambulatory Visit: Payer: Self-pay | Admitting: Adult Health

## 2022-07-16 ENCOUNTER — Encounter: Payer: Self-pay | Admitting: Adult Health

## 2022-07-16 ENCOUNTER — Ambulatory Visit (INDEPENDENT_AMBULATORY_CARE_PROVIDER_SITE_OTHER): Payer: 59 | Admitting: Adult Health

## 2022-07-16 VITALS — BP 149/79 | HR 85 | Ht 72.0 in | Wt 232.8 lb

## 2022-07-16 DIAGNOSIS — G43709 Chronic migraine without aura, not intractable, without status migrainosus: Secondary | ICD-10-CM | POA: Diagnosis not present

## 2022-07-16 MED ORDER — QULIPTA 30 MG PO TABS: 30.0000 mg | ORAL_TABLET | Freq: Every day | ORAL | 11 refills | Status: DC

## 2022-07-16 NOTE — Patient Instructions (Signed)
Your Plan:  Stop Ajovy Start Qulipta 30 mg daily  Take Ubrelvy at the onset of migraine.  If your symptoms worsen or you develop new symptoms please let us know.   Thank you for coming to see [place] at [facility]. I hope we have been able to provide you high quality care today.  You may receive a patient satisfaction survey over the next few weeks. We would appreciate your feedback and comments so that we may continue to improve ourselves and the health of our patients.
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
## 2022-07-22 ENCOUNTER — Other Ambulatory Visit (HOSPITAL_COMMUNITY): Payer: Self-pay

## 2022-07-22 ENCOUNTER — Telehealth: Payer: Self-pay | Admitting: *Deleted

## 2022-07-22 NOTE — Telephone Encounter (Signed)
Pharmacy Patient Advocate Encounter   Received notification from [place] that prior authorization for Qulipta 30MG  tablets is required/requested.   PA submitted on 07/22/2022 to (ins) [employer] via Cover[employer]Meds [employer] or [facility]) confirmation # [id number] Status is pending

## 2022-09-10 ENCOUNTER — Other Ambulatory Visit: Payer: Self-pay | Admitting: Family Medicine

## 2022-09-14 ENCOUNTER — Telehealth: Payer: Self-pay

## 2022-09-14 ENCOUNTER — Other Ambulatory Visit (HOSPITAL_COMMUNITY): Payer: Self-pay

## 2022-09-14 NOTE — Telephone Encounter (Signed)
Pharmacy Patient Advocate Encounter   Received notification from [facility] that prior authorization for Ubrelvy 100MG  tablets is required/requested.   PA submitted on 09/14/2022 to (ins) [employer] via CoverMyMeds Key or [facility]) confirmation # [id number]  Status is pending

## 2022-09-21 NOTE — Telephone Encounter (Signed)
Patient Advocate Encounter  Prior Authorization for Aaron Frey 100MG  tablets has been approved.    PA# [id number] Insurance [employer] Electronic Prior Authorization Form Effective dates: 09/14/2022 through 09/14/2023

## 2022-10-02 ENCOUNTER — Telehealth: Payer: Self-pay | Admitting: Family Medicine

## 2022-10-02 NOTE — Telephone Encounter (Signed)
Patient called stating he had dropped off FMLA paperwork during his last visit. He has heard back from [employer] that it has been denied due to the forms being incomplete.  Patient stated section 2 was not complete and there was no start or end date.  Please advise.

## 2022-10-07 NOTE — Telephone Encounter (Signed)
Patient emailed copy of FMLA paperwork to me to be corrected, forms placed in Dr. Ashley Royalty box, thanks.

## 2022-10-13 ENCOUNTER — Other Ambulatory Visit: Payer: Self-pay | Admitting: Adult Health

## 2022-10-29 ENCOUNTER — Other Ambulatory Visit: Payer: Self-pay

## 2022-11-01 ENCOUNTER — Other Ambulatory Visit: Payer: Self-pay

## 2022-11-01 MED ORDER — SILDENAFIL CITRATE 100 MG PO TABS: 50.0000 mg | ORAL_TABLET | Freq: Every day | ORAL | 11 refills | Status: DC | PRN

## 2022-12-19 ENCOUNTER — Telehealth: Payer: Self-pay | Admitting: Family Medicine

## 2022-12-19 MED ORDER — AMLODIPINE BESYLATE 10 MG PO TABS: ORAL_TABLET | ORAL | 2 refills | Status: DC

## 2022-12-19 NOTE — Telephone Encounter (Signed)
Patient informed. 

## 2022-12-19 NOTE — Telephone Encounter (Signed)
Pt requesting refill on amlodipine. Pharmacy is [facility] on [address] in [place]. Contact number on file is correct.

## 2022-12-20 ENCOUNTER — Other Ambulatory Visit: Payer: Self-pay

## 2022-12-20 MED ORDER — GABAPENTIN 300 MG PO CAPS: 300.0000 mg | ORAL_CAPSULE | Freq: Every day | ORAL | 1 refills | Status: DC

## 2022-12-27 ENCOUNTER — Encounter: Payer: Self-pay | Admitting: Pharmacist

## 2022-12-31 ENCOUNTER — Ambulatory Visit (INDEPENDENT_AMBULATORY_CARE_PROVIDER_SITE_OTHER): Payer: 59 | Admitting: Family Medicine

## 2022-12-31 ENCOUNTER — Encounter: Payer: Self-pay | Admitting: Family Medicine

## 2022-12-31 VITALS — BP 130/68 | HR 88 | Ht 72.0 in | Wt 226.0 lb

## 2022-12-31 DIAGNOSIS — E781 Pure hyperglyceridemia: Secondary | ICD-10-CM | POA: Diagnosis not present

## 2022-12-31 DIAGNOSIS — I10 Essential (primary) hypertension: Secondary | ICD-10-CM

## 2022-12-31 DIAGNOSIS — Z125 Encounter for screening for malignant neoplasm of prostate: Secondary | ICD-10-CM

## 2022-12-31 DIAGNOSIS — G43709 Chronic migraine without aura, not intractable, without status migrainosus: Secondary | ICD-10-CM

## 2022-12-31 DIAGNOSIS — M47812 Spondylosis without myelopathy or radiculopathy, cervical region: Secondary | ICD-10-CM

## 2022-12-31 MED ORDER — METHOCARBAMOL 500 MG PO TABS: ORAL_TABLET | ORAL | 1 refills | Status: DC

## 2023-01-04 LAB — PSA: Prostate Specific Ag, Serum: 6.6 ng/mL — ABNORMAL HIGH (ref 0.0–4.0)

## 2023-01-04 LAB — CMP14+EGFR
ALT: 21 IU/L (ref 0–44)
AST: 24 IU/L (ref 0–40)
Albumin: 4.6 g/dL (ref 3.8–4.9)
Alkaline Phosphatase: 81 IU/L (ref 44–121)
BUN/Creatinine Ratio: 11 (ref 9–20)
BUN: 15 mg/dL (ref 6–24)
Bilirubin Total: 0.3 mg/dL (ref 0.0–1.2)
CO2: 24 mmol/L (ref 20–29)
Calcium: 9.8 mg/dL (ref 8.7–10.2)
Chloride: 101 mmol/L (ref 96–106)
Creatinine, Ser: 1.4 mg/dL — ABNORMAL HIGH (ref 0.76–1.27)
Globulin, Total: 3.1 g/dL (ref 1.5–4.5)
Glucose: 92 mg/dL (ref 70–99)
Potassium: 4.5 mmol/L (ref 3.5–5.2)
Sodium: 140 mmol/L (ref 134–144)
Total Protein: 7.7 g/dL (ref 6.0–8.5)
eGFR: 58 mL/min/{1.73_m2} — ABNORMAL LOW (ref >59)

## 2023-01-04 LAB — CBC WITH DIFFERENTIAL/PLATELET
Basophils Absolute: 0.1 10*3/uL (ref 0.0–0.2)
Basos: 1 %
EOS (ABSOLUTE): 0.1 10*3/uL (ref 0.0–0.4)
Eos: 1 %
Hematocrit: 42.5 % (ref 37.5–51.0)
Hemoglobin: 14.2 g/dL (ref 13.0–17.7)
Immature Grans (Abs): 0 10*3/uL (ref 0.0–0.1)
Immature Granulocytes: 0 %
Lymphocytes Absolute: 2.6 10*3/uL (ref 0.7–3.1)
Lymphs: 35 %
MCH: 27.9 pg (ref 26.6–33.0)
MCHC: 33.4 g/dL (ref 31.5–35.7)
MCV: 84 fL (ref 79–97)
Monocytes Absolute: 0.5 10*3/uL (ref 0.1–0.9)
Monocytes: 7 %
Neutrophils Absolute: 4.1 10*3/uL (ref 1.4–7.0)
Neutrophils: 56 %
Platelets: 307 10*3/uL (ref 150–450)
RBC: 5.09 x10E6/uL (ref 4.14–5.80)
RDW: 13.8 % (ref 11.6–15.4)
WBC: 7.4 10*3/uL (ref 3.4–10.8)

## 2023-01-04 LAB — LIPID PANEL WITH LDL/HDL RATIO
Cholesterol, Total: 177 mg/dL (ref 100–199)
HDL: 34 mg/dL — ABNORMAL LOW (ref >39)
LDL Chol Calc (NIH): 106 mg/dL — ABNORMAL HIGH (ref 0–99)
LDL/HDL Ratio: 3.1 ratio (ref 0.0–3.6)
Triglycerides: 215 mg/dL — ABNORMAL HIGH (ref 0–149)
VLDL Cholesterol Cal: 37 mg/dL (ref 5–40)

## 2023-01-05 NOTE — Progress Notes (Signed)
Aaron Frey - 62 y.o. male MRN [id number]  Date of birth: 19-Sep-1960  Subjective Chief Complaint  Patient presents with   Hypertension   FMLA    HPI Aaron Frey is a 62 year old male here today for follow-up visit.  He needs updated FMLA paperwork completed for his chronic migraines.  Migraines are under little better control but he still continues to have breakthrough episodes.  He remains on Qulipta for prevention of his migraines.    Blood pressure mains well-controlled with amlodipine.  No side effects to current strength.  Denies chest pain, shortness of breath, palpitations, headaches or vision changes.  ROS:  A comprehensive ROS was completed and negative except as noted per HPI   Allergies  Allergen Reactions   Atacand [Candesartan]     Tingling, not able to  tolerate.    Past Medical History:  Diagnosis Date   C2 cervical fracture (HCC) 12/29/2013   C5 vertebral fracture (HCC) 12/29/2013   Class 1 obesity due to excess calories without serious comorbidity with body mass index (BMI) of 32.0 to 32.9 in adult 08/30/2016   Concussion 12/29/2013   Former tobacco use    Hypertension    Hypertriglyceridemia without hypercholesterolemia 09/03/2016   Lumbar transverse process fracture (HCC) 12/29/2013   Migraine    Sacrum and coccyx fracture (HCC) 12/29/2013    Past Surgical History:  Procedure Laterality Date   ACHILLES TENDON REPAIR     lump removed for chest Right    when he was a child   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   Procedure: RIGHT TOTAL HIP ARTHROPLASTY;  Surgeon: Kathryne Hitch, MD;  Location: [facility] OR;  Service: Orthopedics;  Laterality: Right;  RNFA    Social History   Socioeconomic History   Marital status: Single    Spouse name: Not on file   Number of children: 4   Years of education: Not on file   Highest education level: Not on file  Occupational History   Not on file  Tobacco Use   Smoking status: Former    Current  packs/day: 0.00    Types: Cigarettes    Quit date: 2015    Years since quitting: 9.7   Smokeless tobacco: Never   Tobacco comments:    3-4 cigarettes/day  Vaping Use   Vaping status: Never Used  Substance and Sexual Activity   Alcohol use: Yes    Comment: Occasionally- monthly   Drug use: No    Types: Marijuana   Sexual activity: Not on file  Other Topics Concern   Not on file  Social History Narrative   Lives alone   Right handed   Caffeine: unknown    Social Determinants of Health   Financial Resource Strain: Not on file  Food Insecurity: Not on file  Transportation Needs: Not on file  Physical Activity: Not on file  Stress: Not on file  Social Connections: Unknown (09/19/2021)   Received from [facility], [facility]   Social Network    Social Network: Not on file    Family History  Problem Relation Age of Onset   Cancer Mother    Stroke Mother    Dementia Mother    Migraines Mother    ALS Father    Migraines Daughter    Migraines Son     Health Maintenance  Topic Date Due   Zoster Vaccines- Shingrix (2 of 2) 04/01/2023 (Originally 12/02/2016)   INFLUENZA VACCINE  07/21/2023 (Originally 11/21/2022)   HIV Screening  12/31/2023 (Originally 01/03/1978)   COVID-19 Vaccine (1 - 2023-24 season) 01/16/2024 (Originally 12/22/2022)   DTaP/Tdap/Td (2 - Td or Tdap) 12/30/2023   Fecal DNA (Cologuard)  07/01/2024   Hepatitis C Screening  Completed   HPV VACCINES  Aged Out     ----------------------------------------------------------------------------------------------------------------------------------------------------------------------------------------------------------------- Physical Exam BP 130/68   Pulse 88   Ht 6' (1.829 m)   Wt 226 lb (102.5 kg)   SpO2 97%   BMI 30.65 kg/m   Physical Exam Constitutional:      Appearance: Normal appearance.  Eyes:     General: No scleral icterus. Cardiovascular:     Rate and Rhythm: Normal rate and regular  rhythm.  Pulmonary:     Effort: Pulmonary effort is normal.     Breath sounds: Normal breath sounds.  Musculoskeletal:     Cervical back: Neck supple.  Neurological:     Mental Status: He is alert.  Psychiatric:        Mood and Affect: Mood normal.        Behavior: Behavior normal.     ------------------------------------------------------------------------------------------------------------------------------------------------------------------------------------------------------------------- Assessment and Plan  Hypertension Blood pressure is well-controlled.  Recommend continuation of amlodipine at current strength.  Migraine Overall these have improved with Qulipta however still has some breakthrough episodes.  FMLA paperwork completed to use during these episodes.   Meds ordered this encounter  Medications   methocarbamol (ROBAXIN) 500 MG tablet    Sig: TAKE 1 TABLET(500 MG) BY MOUTH EVERY 6 HOURS AS NEEDED FOR MUSCLE SPASMS    Dispense:  40 tablet    Refill:  1    Return in about 6 months (around 06/30/2023) for Hypertension.    This visit occurred during the SARS-CoV-2 public health emergency.  Safety protocols were in place, including screening questions prior to the visit, additional usage of staff PPE, and extensive cleaning of exam room while observing appropriate contact time as indicated for disinfecting solutions.

## 2023-01-05 NOTE — Assessment & Plan Note (Signed)
Blood pressure is well-controlled.  Recommend continuation of amlodipine at current strength.

## 2023-01-05 NOTE — Assessment & Plan Note (Signed)
Overall these have improved with Qulipta however still has some breakthrough episodes.  FMLA paperwork completed to use during these episodes.

## 2023-01-07 ENCOUNTER — Telehealth: Payer: Self-pay

## 2023-01-07 NOTE — Telephone Encounter (Signed)
Contact patient to complete FMLA documentation.   Then fax docs and scan to chart.

## 2023-01-08 ENCOUNTER — Telehealth: Payer: Self-pay

## 2023-01-08 NOTE — Telephone Encounter (Signed)
*  [facility]  Pharmacy Patient Advocate Encounter   Received notification from CoverMyMeds that prior authorization for Qulipta 30MG  tablets  is required/requested.   Insurance verification completed.   The patient is insured through [facility] .   Per test claim: PA required; PA submitted to [facility] via CoverMyMeds Key/confirmation #/EOC [id number] Status is pending

## 2023-01-10 ENCOUNTER — Other Ambulatory Visit: Payer: Self-pay | Admitting: Family Medicine

## 2023-01-10 DIAGNOSIS — R972 Elevated prostate specific antigen [PSA]: Secondary | ICD-10-CM

## 2023-01-15 ENCOUNTER — Other Ambulatory Visit (HOSPITAL_COMMUNITY): Payer: Self-pay

## 2023-01-15 NOTE — Telephone Encounter (Signed)
Pharmacy Patient Advocate Encounter  Received notification from [facility] that Prior Authorization for Qulipta 30MG  tablets has been APPROVED from 01/08/2023 to 01/08/2024   PA #/Case ID/Reference #: PA Case ID #: [id number]

## 2023-02-10 ENCOUNTER — Other Ambulatory Visit: Payer: Self-pay | Admitting: Adult Health

## 2023-02-20 ENCOUNTER — Ambulatory Visit (INDEPENDENT_AMBULATORY_CARE_PROVIDER_SITE_OTHER): Payer: 59 | Admitting: Adult Health

## 2023-02-20 ENCOUNTER — Encounter: Payer: Self-pay | Admitting: Adult Health

## 2023-02-20 VITALS — BP 142/79 | HR 75 | Ht 72.0 in | Wt 224.0 lb

## 2023-02-20 DIAGNOSIS — G43709 Chronic migraine without aura, not intractable, without status migrainosus: Secondary | ICD-10-CM

## 2023-02-20 MED ORDER — QULIPTA 60 MG PO TABS: 60.0000 mg | ORAL_TABLET | Freq: Every day | ORAL | 11 refills | Status: DC

## 2023-02-20 NOTE — Patient Instructions (Addendum)
Your Plan:  Increase Qulipta 60 mg daily  Take Ubrelvy at the onset of migraine migraine can repeat in 2 hours if needed.  Keep a headache journal If your symptoms worsen or you develop new symptoms please let us know.   Thank you for coming to see [place] at [facility]. I hope we have been able to provide you high quality care today.  You may receive a patient satisfaction survey over the next few weeks. We would appreciate your feedback and comments so that we may continue to improve ourselves and the health of our patients.
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
## 2023-05-17 IMAGING — DX DG HIP (WITH OR WITHOUT PELVIS) 2-3V*R*
3 series · 3 of 3 positions shown · non-contrast
Comparison: AP pelvis 12/29/2013.

CLINICAL DATA: Right hip pain.

EXAM:
DG HIP (WITH OR WITHOUT PELVIS) 2-3V RIGHT

[pelvis ap (1 of 2)]
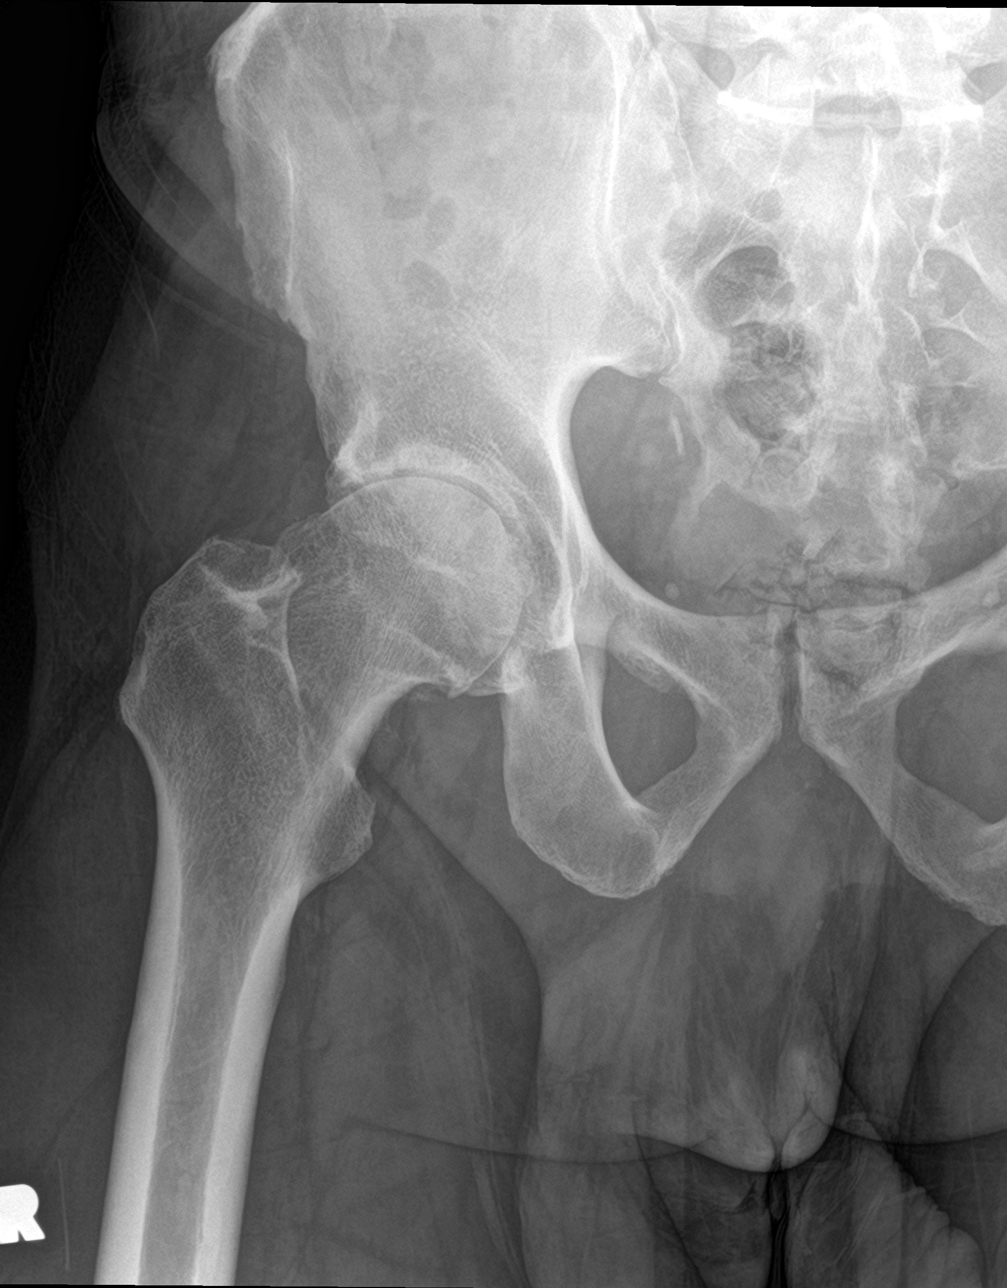

[hip ap]
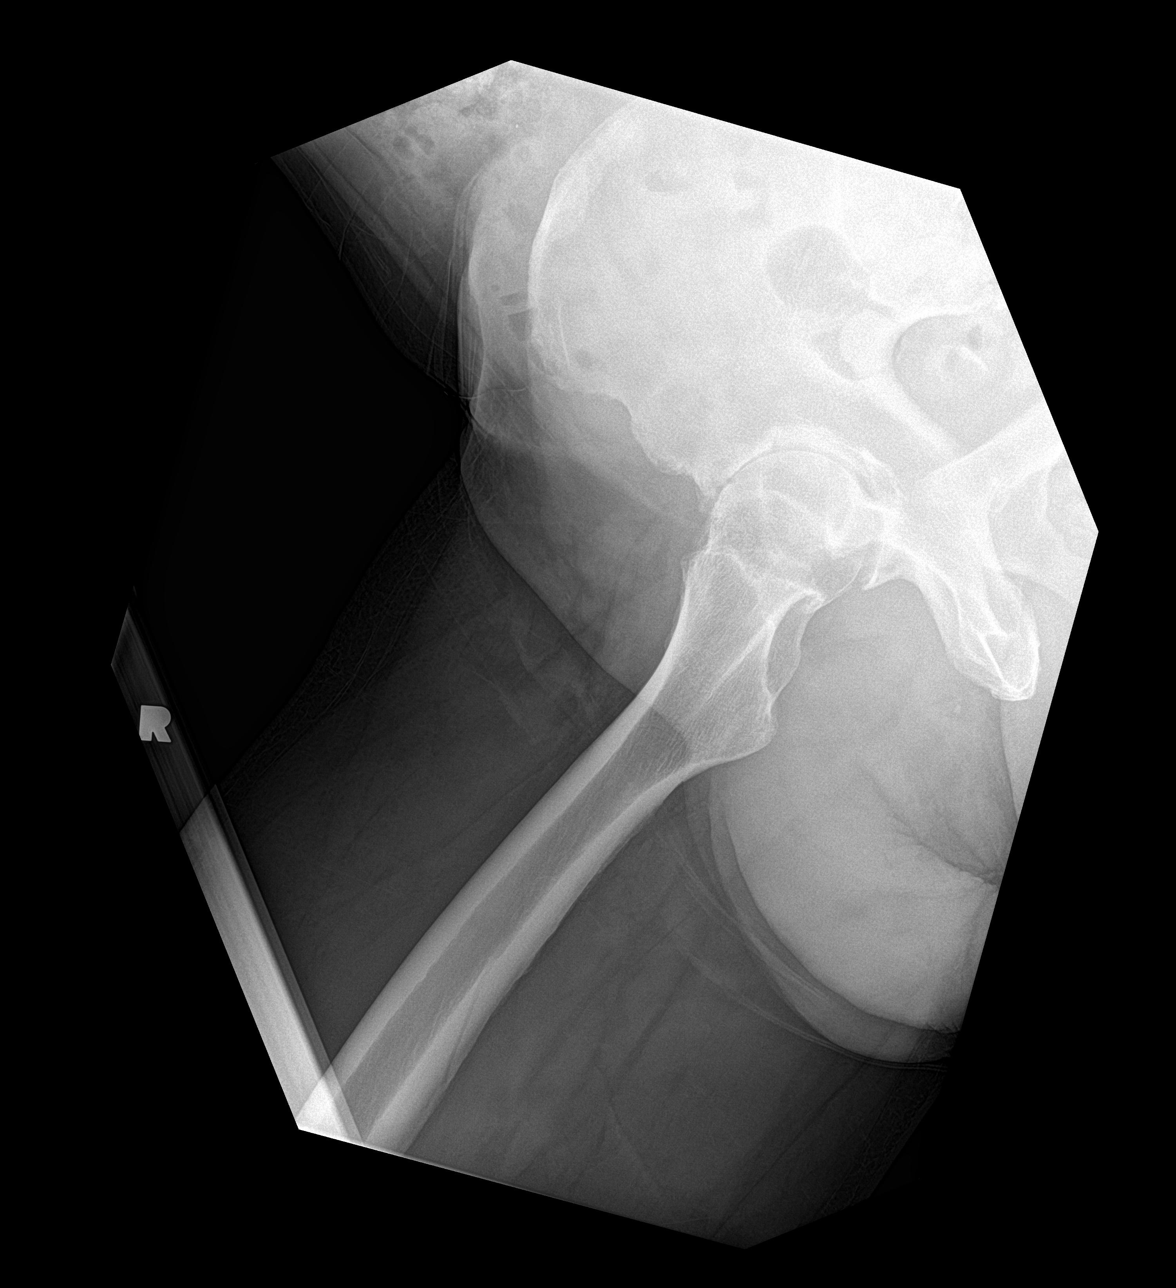

[pelvis ap (2 of 2)]
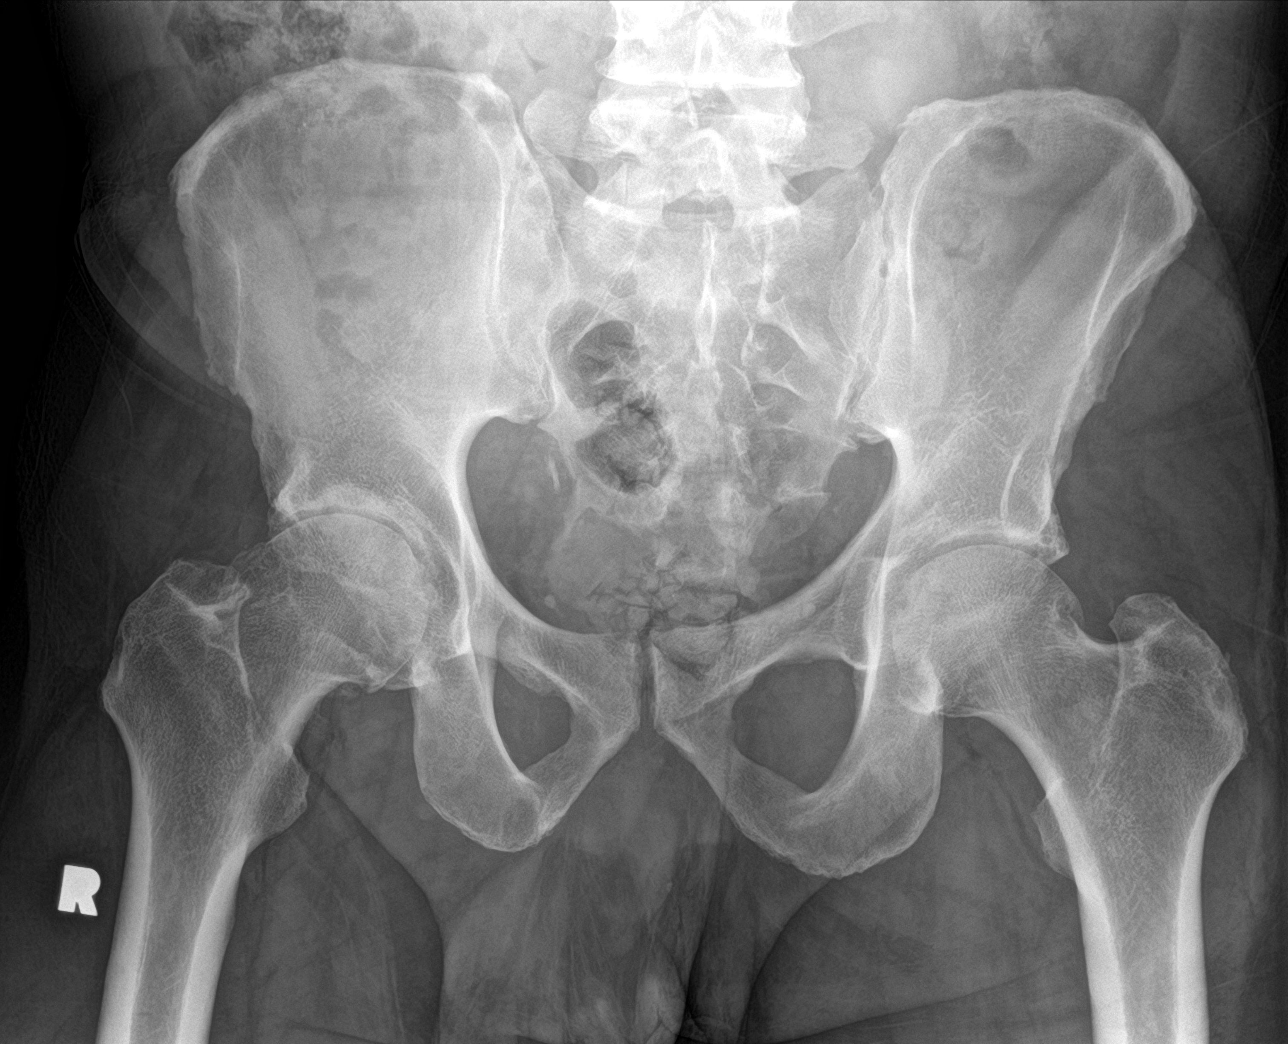

[3 of 3 positions shown; findings below may reference images not displayed]

FINDINGS: Prominent degenerative changes right hip. Loose bodies may be
present. Tiny bony density noted along the lateral aspect of the
right acetabulum. This may represent a tiny osteophyte fracture
fragment. Proximal right femur intact. Degenerative changes left hip
also noted. Vascular calcification noted in the pelvis.
IMPRESSION: Prominent degenerative changes right hip. Loose bodies may be
present. Tiny bony density noted along the lateral aspect of the
right acetabulum. This may represent a tiny osteophyte fracture
fragment.

## 2023-06-06 DIAGNOSIS — R972 Elevated prostate specific antigen [PSA]: Secondary | ICD-10-CM | POA: Insufficient documentation

## 2023-06-06 DIAGNOSIS — N529 Male erectile dysfunction, unspecified: Secondary | ICD-10-CM | POA: Insufficient documentation

## 2023-06-18 ENCOUNTER — Other Ambulatory Visit: Payer: Self-pay

## 2023-06-18 MED ORDER — GABAPENTIN 300 MG PO CAPS: 300.0000 mg | ORAL_CAPSULE | Freq: Every day | ORAL | 0 refills | Status: DC

## 2023-06-26 ENCOUNTER — Other Ambulatory Visit (HOSPITAL_COMMUNITY): Payer: Self-pay

## 2023-06-26 ENCOUNTER — Telehealth: Payer: Self-pay | Admitting: Pharmacy Technician

## 2023-06-26 NOTE — Telephone Encounter (Signed)
 Pharmacy Patient Advocate Encounter   Received notification from CoverMyMeds that prior authorization for Qulipta 60MG  tablets is required/requested.   Insurance verification completed.   The patient is insured through [facility] .   Per test claim: Refill too soon. PA is not needed at this time. Medication was filled 06/09/2023. Next eligible fill date is 07/02/2023.

## 2023-06-30 ENCOUNTER — Ambulatory Visit: Payer: 59 | Admitting: Family Medicine

## 2023-07-24 ENCOUNTER — Encounter: Payer: Self-pay | Admitting: Family Medicine

## 2023-07-24 ENCOUNTER — Ambulatory Visit (INDEPENDENT_AMBULATORY_CARE_PROVIDER_SITE_OTHER): Payer: 59 | Admitting: Family Medicine

## 2023-07-24 VITALS — BP 149/77 | HR 87 | Ht 72.0 in | Wt 228.0 lb

## 2023-07-24 DIAGNOSIS — M79671 Pain in right foot: Secondary | ICD-10-CM | POA: Diagnosis not present

## 2023-07-24 DIAGNOSIS — G43709 Chronic migraine without aura, not intractable, without status migrainosus: Secondary | ICD-10-CM | POA: Diagnosis not present

## 2023-07-24 DIAGNOSIS — M79672 Pain in left foot: Secondary | ICD-10-CM

## 2023-07-24 DIAGNOSIS — I10 Essential (primary) hypertension: Secondary | ICD-10-CM

## 2023-07-27 DIAGNOSIS — M79671 Pain in right foot: Secondary | ICD-10-CM | POA: Insufficient documentation

## 2023-07-27 NOTE — Assessment & Plan Note (Signed)
 Blood pressure is elevated however reports that he is out of amlodipine.  He will pick up prescription and take as directed.  Return in about 3 weeks for blood pressure recheck.  Low-sodium diet encouraged.

## 2023-07-27 NOTE — Progress Notes (Signed)
 Aaron Frey - 63 y.o. male MRN [id number]  Date of birth: 07-20-1960  Subjective Chief Complaint  Patient presents with   Hypertension    HPI Aaron Frey is a 63 year old male here today for follow-up visit.  Reports he is doing pretty well.  Blood pressure is elevated today however he has been out of amlodipine.  He denies symptoms related to hypertension including chest pain, shortness of breath, palpitations, headaches or vision changes.  He has not been quite as active due to foot pain.  He did have orthotics previously and would like to have new orthotics made.  Requesting referral to podiatry.  Migraines are managed by podiatry.  Fairly stable with current medications.  ROS:  A comprehensive ROS was completed and negative except as noted per HPI  Allergies  Allergen Reactions   Atacand [Candesartan]     Tingling, not able to  tolerate.    Past Medical History:  Diagnosis Date   C2 cervical fracture (HCC) 12/29/2013   C5 vertebral fracture (HCC) 12/29/2013   Class 1 obesity due to excess calories without serious comorbidity with body mass index (BMI) of 32.0 to 32.9 in adult 08/30/2016   Concussion 12/29/2013   Former tobacco use    Hypertension    Hypertriglyceridemia without hypercholesterolemia 09/03/2016   Lumbar transverse process fracture (HCC) 12/29/2013   Migraine    Sacrum and coccyx fracture (HCC) 12/29/2013    Past Surgical History:  Procedure Laterality Date   ACHILLES TENDON REPAIR     lump removed for chest Right    when he was a child   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   Procedure: RIGHT TOTAL HIP ARTHROPLASTY;  Surgeon: Kathryne Hitch, MD;  Location: [facility] OR;  Service: Orthopedics;  Laterality: Right;  RNFA    Social History   Socioeconomic History   Marital status: Single    Spouse name: Not on file   Number of children: 4   Years of education: Not on file   Highest education level: Not on file  Occupational History    Not on file  Tobacco Use   Smoking status: Former    Current packs/day: 0.00    Types: Cigarettes    Quit date: 2015    Years since quitting: 10.2   Smokeless tobacco: Never   Tobacco comments:    3-4 cigarettes/day  Vaping Use   Vaping status: Never Used  Substance and Sexual Activity   Alcohol use: Yes    Comment: Occasionally- monthly   Drug use: No    Types: Marijuana   Sexual activity: Not on file  Other Topics Concern   Not on file  Social History Narrative   Lives alone   Right handed   Caffeine: 1 cup on 5 days of the week   Social Drivers of Health   Financial Resource Strain: Low Risk  (07/20/2023)   Overall Financial Resource Strain (CARDIA)    Difficulty of Paying Living Expenses: Not hard at all  Food Insecurity: No Food Insecurity (07/20/2023)   Hunger Vital Sign    Worried About Running Out of Food in the Last Year: Never true    Ran Out of Food in the Last Year: Never true  Transportation Needs: No Transportation Needs (07/20/2023)   PRAPARE - Administrator, Civil Service (Medical): No    Lack of Transportation (Non-Medical): No  Physical Activity: Insufficiently Active (07/20/2023)   Exercise Vital Sign    Days of Exercise per Week: 2  days    Minutes of Exercise per Session: 30 min  Stress: No Stress Concern Present (07/20/2023)   [employer] of Occupational Health - Occupational Stress Questionnaire    Feeling of Stress : Not at all  Social Connections: Unknown (07/20/2023)   Social Connection and Isolation Panel [NHANES]    Frequency of Communication with Friends and Family: Patient declined    Frequency of Social Gatherings with Friends and Family: Patient declined    Attends Religious Services: Patient declined    Database administrator or Organizations: No    Attends Engineer, structural: Not on file    Marital Status: Never married    Family History  Problem Relation Age of Onset   Cancer Mother    Stroke Mother     Dementia Mother    Migraines Mother    ALS Father    Migraines Daughter    Migraines Son     Health Maintenance  Topic Date Due   Zoster Vaccines- Shingrix (2 of 2) 12/02/2016   HIV Screening  12/31/2023 (Originally 01/03/1976)   COVID-19 Vaccine (1 - 2024-25 season) 01/16/2024 (Originally 12/22/2022)   INFLUENZA VACCINE  11/21/2023   DTaP/Tdap/Td (2 - Td or Tdap) 12/30/2023   Fecal DNA (Cologuard)  07/01/2024   Hepatitis C Screening  Completed   HPV VACCINES  Aged Out     ----------------------------------------------------------------------------------------------------------------------------------------------------------------------------------------------------------------- Physical Exam BP (!) 149/77   Pulse 87   Ht 6' (1.829 m)   Wt 228 lb (103.4 kg)   SpO2 98%   BMI 30.92 kg/m   Physical Exam Constitutional:      Appearance: Normal appearance.  HENT:     Head: Normocephalic and atraumatic.  Eyes:     General: No scleral icterus. Cardiovascular:     Rate and Rhythm: Normal rate and regular rhythm.  Pulmonary:     Effort: Pulmonary effort is normal.     Breath sounds: Normal breath sounds.  Musculoskeletal:     Cervical back: Neck supple.  Neurological:     Mental Status: He is alert.  Psychiatric:        Mood and Affect: Mood normal.        Behavior: Behavior normal.     ------------------------------------------------------------------------------------------------------------------------------------------------------------------------------------------------------------------- Assessment and Plan  Bilateral foot pain Requesting referral to podiatry for updated orthotics.  Orders entered.  Hypertension Blood pressure is elevated however reports that he is out of amlodipine.  He will pick up prescription and take as directed.  Return in about 3 weeks for blood pressure recheck.  Low-sodium diet encouraged.  Migraine Overall stable.  Seeing neurology  for management.   No orders of the defined types were placed in this encounter.   Return in about 3 weeks (around 08/14/2023) for nurse visit for BP check.

## 2023-07-27 NOTE — Assessment & Plan Note (Signed)
 Overall stable.  Seeing neurology for management.

## 2023-07-27 NOTE — Assessment & Plan Note (Signed)
 Requesting referral to podiatry for updated orthotics.  Orders entered.

## 2023-08-01 ENCOUNTER — Ambulatory Visit (INDEPENDENT_AMBULATORY_CARE_PROVIDER_SITE_OTHER): Admitting: Podiatry

## 2023-08-01 ENCOUNTER — Other Ambulatory Visit: Payer: Self-pay

## 2023-08-01 ENCOUNTER — Ambulatory Visit

## 2023-08-01 DIAGNOSIS — M792 Neuralgia and neuritis, unspecified: Secondary | ICD-10-CM

## 2023-08-01 DIAGNOSIS — M7751 Other enthesopathy of right foot: Secondary | ICD-10-CM

## 2023-08-01 DIAGNOSIS — M7752 Other enthesopathy of left foot: Secondary | ICD-10-CM

## 2023-08-01 NOTE — Progress Notes (Signed)
  Subjective:  Patient ID: Aaron Frey, male    DOB: [birth year],   MRN: [id number]  No chief complaint on file.   63 y.o. male presents for concern of right foot pain that has been ongoing for several years. Relates he has had pain in this foot burning and tinlging type pain since he had surgery on his foot. He relates a couple years. States he has had some gabapentin that has helped. Has had some previous X-rays that show some degenerative changes in his back. History of achilles tendon surgery in 2017 . Denies any other pedal complaints. Denies n/v/f/c.   Past Medical History:  Diagnosis Date   C2 cervical fracture (HCC) 12/29/2013   C5 vertebral fracture (HCC) 12/29/2013   Class 1 obesity due to excess calories without serious comorbidity with body mass index (BMI) of 32.0 to 32.9 in adult 08/30/2016   Concussion 12/29/2013   Former tobacco use    Hypertension    Hypertriglyceridemia without hypercholesterolemia 09/03/2016   Lumbar transverse process fracture (HCC) 12/29/2013   Migraine    Sacrum and coccyx fracture (HCC) 12/29/2013    Objective:  Physical Exam: Vascular: DP/PT pulses 2/4 bilateral. CFT <3 seconds. Normal hair growth on digits. No edema.  Skin. No lacerations or abrasions bilateral feet.  Musculoskeletal: MMT 5/5 bilateral lower extremities in DF, PF, Inversion and Eversion. Deceased ROM in DF of ankle joint. Tender along achilles and achilles insertion. Tender to anterior ankle and ball of foot. Fairly sensitive to any palpation about the foot.  Neurological: Sensation intact to light touch. Very sensitive  to light touch on right foot.   Assessment:   1. Neuritis      Plan:  Patient was evaluated and treated and all questions answered. X-rays reviewed and discussed with patient. No acute fractures or dislocations noted. Spurring noted anterior dorsal talus.  Discussed radiculopathy vs neuritis vs neuropathy  and etiology as well as treatment with  patient.  -Continue gabapentin.  -Discussed supportive shoes at all times and checking feet regularly.  -Follow-up with PCP for prescription management of gabapentin.  -Referral to neurology for possible NCV/EMG for further work-up  -Patient to return as needed   Louann Sjogren, DPM

## 2023-08-04 ENCOUNTER — Other Ambulatory Visit: Payer: Self-pay | Admitting: Podiatry

## 2023-08-04 DIAGNOSIS — M792 Neuralgia and neuritis, unspecified: Secondary | ICD-10-CM

## 2023-08-06 ENCOUNTER — Encounter: Payer: Self-pay | Admitting: Neurology

## 2023-08-06 ENCOUNTER — Ambulatory Visit (INDEPENDENT_AMBULATORY_CARE_PROVIDER_SITE_OTHER): Admitting: Neurology

## 2023-08-06 ENCOUNTER — Other Ambulatory Visit: Payer: Self-pay | Admitting: Family Medicine

## 2023-08-06 VITALS — BP 142/83 | HR 80 | Ht 72.0 in | Wt 232.2 lb

## 2023-08-06 DIAGNOSIS — R200 Anesthesia of skin: Secondary | ICD-10-CM | POA: Diagnosis not present

## 2023-08-06 DIAGNOSIS — M79604 Pain in right leg: Secondary | ICD-10-CM

## 2023-08-06 DIAGNOSIS — S948X1A Injury of other nerves at ankle and foot level, right leg, initial encounter: Secondary | ICD-10-CM | POA: Diagnosis not present

## 2023-08-06 DIAGNOSIS — M792 Neuralgia and neuritis, unspecified: Secondary | ICD-10-CM | POA: Diagnosis not present

## 2023-08-06 DIAGNOSIS — M47812 Spondylosis without myelopathy or radiculopathy, cervical region: Secondary | ICD-10-CM

## 2023-08-06 DIAGNOSIS — R202 Paresthesia of skin: Secondary | ICD-10-CM
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
## 2023-08-14 ENCOUNTER — Ambulatory Visit

## 2023-08-15 ENCOUNTER — Ambulatory Visit

## 2023-08-19 ENCOUNTER — Other Ambulatory Visit (HOSPITAL_COMMUNITY): Payer: Self-pay

## 2023-08-19 ENCOUNTER — Telehealth: Payer: Self-pay

## 2023-08-19 NOTE — Telephone Encounter (Signed)
 Pharmacy Patient Advocate Encounter  Received notification from [employer] that Prior Authorization for PA Case ID #: [id number] has been APPROVED from 08/19/2023 to 08/18/2024   PA #/Case ID/Reference #: PA Case ID #: [id number]

## 2023-08-19 NOTE — Telephone Encounter (Signed)
 Pharmacy Patient Advocate Encounter   Received notification from CoverMyMeds that prior authorization for Ubrelvy  100MG  tablets is required/requested.   Insurance verification completed.   The patient is insured through [facility] .   Per test claim: PA required; PA submitted to above mentioned insurance via CoverMyMeds Key/confirmation #/EOC [id number] Status is pending

## 2023-09-08 ENCOUNTER — Telehealth: Payer: Self-pay | Admitting: Family Medicine

## 2023-09-08 NOTE — Telephone Encounter (Signed)
 Patient dropped off document FMLA, to be filled out by provider. Patient requested to send it back via Call Patient to pick up within 7-days. Document is located in providers tray at front office.Please advise at [facility] [phone]

## 2023-09-10 ENCOUNTER — Other Ambulatory Visit: Payer: Self-pay

## 2023-09-10 MED ORDER — AMLODIPINE BESYLATE 10 MG PO TABS: ORAL_TABLET | ORAL | 1 refills | Status: DC

## 2023-09-12 NOTE — Telephone Encounter (Signed)
 Called patient to inform paperwork is ready, will come by to pay fees

## 2023-09-25 ENCOUNTER — Ambulatory Visit (INDEPENDENT_AMBULATORY_CARE_PROVIDER_SITE_OTHER): Admitting: Neurology

## 2023-09-25 ENCOUNTER — Encounter: Payer: Self-pay | Admitting: Neurology

## 2023-09-25 VITALS — BP 142/81 | HR 85 | Ht 72.0 in | Wt 224.5 lb

## 2023-09-25 DIAGNOSIS — M5416 Radiculopathy, lumbar region: Secondary | ICD-10-CM

## 2023-09-25 DIAGNOSIS — R202 Paresthesia of skin: Secondary | ICD-10-CM | POA: Diagnosis not present

## 2023-09-25 DIAGNOSIS — R200 Anesthesia of skin: Secondary | ICD-10-CM | POA: Diagnosis not present

## 2023-09-25 DIAGNOSIS — S948X1A Injury of other nerves at ankle and foot level, right leg, initial encounter: Secondary | ICD-10-CM

## 2023-09-25 DIAGNOSIS — M79604 Pain in right leg: Secondary | ICD-10-CM

## 2023-09-25 NOTE — Progress Notes (Unsigned)
 Full Name: Aaron Frey Gender: Male MRN #: [id number] Date of Birth: 1961/02/28    Visit Date: 09/25/2023 11:46 Age: 63 Years  History: Right distal leg pain and numbness after surgery.  He also reports chronic low back pain, radiating pain from the low back, feels funny when I palpate the fibular head of the right lower extremity.  Summary: Nerve conduction studies were performed on the bilateral lower extremities.  The right peroneal motor nerve showed no response at the popliteal fossa.  The right sural sensory nerve showed no response.  The right superficial peroneal sensory nerve showed no response.  The right tibial F wave showed delayed latency (61.7 ms, normal less than 56 ms) and the left tibial F wave showed delayed latency (60.3 ms, normal less than 56 ms).  EMG needle study was performed on the right lower extremity: The right lower lumbar paraspinal muscles showed spontaneous activity. All remaining nerves and all remaining muscles (as indicated in the following tables) were within normal limits.       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  Could also be a double crush syndrome. The right lower extremity sensory conductions showed no response which corresponds with patient's sensory exam and sensory complaints in the right lower extremity. Patient states that the sensory changes started after surgery for Achilles tendon repair. The Sural sensory nerve runs along the lateral side of the Achilles tendon and can possibly be injured during surgery, either through direct trauma or compression, however it is difficult to know if this occurred. Less frequently, the superficial peroneal nerve can also be affected during surgery but it can also be abnormal in peroneal  neuropathy. Discussed with patient, further evaluation can include MRI of the lumbar spine for right-sided L5/S1 radiculopathy and subsequently further evaluation for right-sided peroneal neuropathy.      ------------------------------- Aldona Amel, M.D.  [facility] [address], [address] [place], [place] [address] Tel: [phone] Fax: [phone]  Verbal informed consent was obtained from the patient, patient was informed of potential risk of procedure, including bruising, bleeding, hematoma formation, infection, muscle weakness, muscle pain, numbness, among others.        MNC    Nerve / Sites Muscle Latency Ref. Amplitude Ref. Rel Amp Segments Distance Velocity Ref. Area    ms ms mV mV %  cm m/s m/s mVms  R Peroneal - EDB     Ankle EDB 5.3 <=6.5 3.1 >=2.0 100 Ankle - EDB 9   9.3     Fib head EDB 13.6  2.2  71.4 Fib head - Ankle 41 49 >=44 7.5     Pop fossa EDB NR  NR  NR Pop fossa - Fib head 17 NR >=44 NR         Pop fossa - Ankle      L Peroneal - EDB     Ankle EDB 6.5 <=6.5 5.6 >=2.0 100 Ankle - EDB 9   15.6     Fib head EDB 13.3  6.2  111 Fib head - Ankle 42 61 >=44 18.1     Pop fossa EDB 15.2  8.0  129 Pop fossa - Fib head 12 64 >=44 28.3         Pop fossa - Ankle  R Tibial - AH     Ankle AH 5.1 <=5.8 4.2 >=4.0 100 Ankle - AH 9   11.5     Pop fossa AH 16.5  3.2  77 Pop fossa - Ankle 51 45 >=41 9.9  L Tibial - AH     Ankle AH 5.5 <=5.8 4.8 >=4.0 100 Ankle - AH 9   11.7     Pop fossa AH 18.3  5.7  119 Pop fossa - Ankle 50 45 >=41 16.2             SNC    Nerve / Sites Rec. Site Peak Lat Ref.  Amp Ref. Segments Distance    ms ms V V  cm  R Sural - Ankle (Calf)     Calf Ankle NR <=4.4 NR >=6 Calf - Ankle 14  L Sural - Ankle (Calf)     Calf Ankle 2.9 <=4.4 9 >=6 Calf - Ankle 14  R Superficial peroneal - Ankle     Lat leg Ankle NR <=4.4 NR >=6 Lat leg - Ankle 14  L Superficial peroneal - Ankle     Lat leg Ankle 3.3 <=4.4 13 >=6 Lat leg -  Ankle 14             F  Wave    Nerve F Lat Ref.   ms ms  R Tibial - AH 61.7 <=56.0  L Tibial - AH 60.3 <=56.0         EMG Summary Table    Spontaneous MUAP Recruitment  Muscle IA Fib PSW Fasc Other Amp Dur. Poly Pattern  R. Vastus medialis Normal None None None _______ Normal Normal Normal Normal  R. Tibialis anterior Normal None None None _______ Normal Normal Normal Normal  R. Gastrocnemius (Medial head) Normal None None None _______ Increased Normal Normal Reduced  R. Peroneus longus Normal None None None _______ Normal Normal Normal Normal  R. Extensor hallucis longus Normal None None None _______ Normal Normal Normal Normal  R. Biceps femoris (long head) Normal None None None _______ Normal Normal Normal Normal  R. Gluteus maximus Normal None None None _______ Normal Normal Normal Normal  R. Gluteus medius Normal None None None _______ Normal Normal Normal Normal  R. Lumbar paraspinals (low) Normal None 2+ None _______ Normal Normal Normal Normal

## 2023-09-30 ENCOUNTER — Telehealth: Payer: Self-pay | Admitting: Family Medicine

## 2023-09-30 NOTE — Telephone Encounter (Signed)
 Pt brought forms back in stating that he was told they were incomplete. Sections 2 and 3 needed to be filled out. They needed to be resigned and dated. I have placed in providers box.

## 2023-10-01 DIAGNOSIS — R200 Anesthesia of skin: Secondary | ICD-10-CM | POA: Insufficient documentation

## 2023-10-01 DIAGNOSIS — M79604 Pain in right leg: Secondary | ICD-10-CM | POA: Insufficient documentation

## 2023-10-01 NOTE — Procedures (Signed)
 Full Name: Aaron Frey Gender: Male MRN #: [id number] Date of Birth: 11-13-1960    Visit Date: 09/25/2023 11:46 Age: 63 Years  History: Right distal leg pain and numbness after surgery.  He also reports chronic low back pain, radiating pain from the low back, feels funny when I palpate the fibular head of the right lower extremity.  Summary: Nerve conduction studies were performed on the bilateral lower extremities.  The right peroneal motor nerve showed no response at the popliteal fossa.  The right sural sensory nerve showed no response.  The right superficial peroneal sensory nerve showed no response.  The right tibial F wave showed delayed latency (61.7 ms, normal less than 56 ms) and the left tibial F wave showed delayed latency (60.3 ms, normal less than 56 ms).  EMG needle study was performed on the right lower extremity: The right lower lumbar paraspinal muscles showed spontaneous activity. All remaining nerves and all remaining muscles (as indicated in the following tables) were within normal limits.       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  Could also be a double crush syndrome. The right lower extremity sensory conductions showed no response which corresponds with patient's sensory exam and sensory complaints in the right lower extremity. Patient states that the sensory changes started after surgery for Achilles tendon repair. The Sural sensory nerve runs along the lateral side of the Achilles tendon and can possibly be injured during surgery, either through direct trauma or compression, however it is difficult to know if this occurred. Less frequently, the superficial peroneal nerve can also be affected during surgery but it can also be abnormal in peroneal  neuropathy. Discussed with patient, further evaluation can include MRI of the lumbar spine for right-sided L5/S1 radiculopathy and subsequently further evaluation for right-sided peroneal neuropathy.      ------------------------------- Aldona Amel, M.D.  [facility] [address], [address] [place], [place] [address] Tel: [phone] Fax: [phone]  Verbal informed consent was obtained from the patient, patient was informed of potential risk of procedure, including bruising, bleeding, hematoma formation, infection, muscle weakness, muscle pain, numbness, among others.        MNC    Nerve / Sites Muscle Latency Ref. Amplitude Ref. Rel Amp Segments Distance Velocity Ref. Area    ms ms mV mV %  cm m/s m/s mVms  R Peroneal - EDB     Ankle EDB 5.3 <=6.5 3.1 >=2.0 100 Ankle - EDB 9   9.3     Fib head EDB 13.6  2.2  71.4 Fib head - Ankle 41 49 >=44 7.5     Pop fossa EDB NR  NR  NR Pop fossa - Fib head 17 NR >=44 NR         Pop fossa - Ankle      L Peroneal - EDB     Ankle EDB 6.5 <=6.5 5.6 >=2.0 100 Ankle - EDB 9   15.6     Fib head EDB 13.3  6.2  111 Fib head - Ankle 42 61 >=44 18.1     Pop fossa EDB 15.2  8.0  129 Pop fossa - Fib head 12 64 >=44 28.3         Pop fossa - Ankle  R Tibial - AH     Ankle AH 5.1 <=5.8 4.2 >=4.0 100 Ankle - AH 9   11.5     Pop fossa AH 16.5  3.2  77 Pop fossa - Ankle 51 45 >=41 9.9  L Tibial - AH     Ankle AH 5.5 <=5.8 4.8 >=4.0 100 Ankle - AH 9   11.7     Pop fossa AH 18.3  5.7  119 Pop fossa - Ankle 50 45 >=41 16.2             SNC    Nerve / Sites Rec. Site Peak Lat Ref.  Amp Ref. Segments Distance    ms ms V V  cm  R Sural - Ankle (Calf)     Calf Ankle NR <=4.4 NR >=6 Calf - Ankle 14  L Sural - Ankle (Calf)     Calf Ankle 2.9 <=4.4 9 >=6 Calf - Ankle 14  R Superficial peroneal - Ankle     Lat leg Ankle NR <=4.4 NR >=6 Lat leg - Ankle 14  L Superficial peroneal - Ankle     Lat leg Ankle 3.3 <=4.4 13 >=6 Lat leg -  Ankle 14             F  Wave    Nerve F Lat Ref.   ms ms  R Tibial - AH 61.7 <=56.0  L Tibial - AH 60.3 <=56.0         EMG Summary Table    Spontaneous MUAP Recruitment  Muscle IA Fib PSW Fasc Other Amp Dur. Poly Pattern  R. Vastus medialis Normal None None None _______ Normal Normal Normal Normal  R. Tibialis anterior Normal None None None _______ Normal Normal Normal Normal  R. Gastrocnemius (Medial head) Normal None None None _______ Increased Normal Normal Reduced  R. Peroneus longus Normal None None None _______ Normal Normal Normal Normal  R. Extensor hallucis longus Normal None None None _______ Normal Normal Normal Normal  R. Biceps femoris (long head) Normal None None None _______ Normal Normal Normal Normal  R. Gluteus maximus Normal None None None _______ Normal Normal Normal Normal  R. Gluteus medius Normal None None None _______ Normal Normal Normal Normal  R. Lumbar paraspinals (low) Normal None 2+ None _______ Normal Normal Normal Normal

## 2023-10-03 NOTE — Telephone Encounter (Signed)
 Attn: Lenward Railing   Pt Aaron Frey is calling on the status of his FMLA paperwork   Please advise

## 2023-10-09 ENCOUNTER — Telehealth: Admitting: Adult Health

## 2023-10-09 DIAGNOSIS — G43709 Chronic migraine without aura, not intractable, without status migrainosus: Secondary | ICD-10-CM | POA: Diagnosis not present

## 2023-10-09 MED ORDER — NURTEC 75 MG PO TBDP: ORAL_TABLET | ORAL | 11 refills | Status: AC
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
## 2023-10-09 NOTE — Patient Instructions (Signed)
 Your Plan:  Continue Qulipta  60 mg daily Continue Nurtec     Thank you for coming to see us  at [facility]. I hope we have been able to provide you high quality care today.  You may receive a patient satisfaction survey over the next few weeks. We would appreciate your feedback and comments so that we may continue to improve ourselves and the health of our patients.

## 2023-10-13 ENCOUNTER — Telehealth: Payer: Self-pay | Admitting: Pharmacist

## 2023-10-13 NOTE — Telephone Encounter (Signed)
 Pharmacy Patient Advocate Encounter   Received notification from Patient Pharmacy that prior authorization for Nurtec 75MG  dispersible tablets is required/requested.   Insurance verification completed.   The patient is insured through [facility] .   Per test claim: PA required; PA submitted to above mentioned insurance via CoverMyMeds Key/confirmation #/EOC [id number] Status is pending

## 2023-10-13 NOTE — Telephone Encounter (Signed)
 Pharmacy Patient Advocate Encounter  Received notification from [facility] that Prior Authorization for Nurtec 75MG  dispersible tablets has been APPROVED from 10/13/2023 to 10/12/2025   PA #/Case ID/Reference #: [id number]

## 2023-10-14 ENCOUNTER — Ambulatory Visit

## 2023-10-14 DIAGNOSIS — R202 Paresthesia of skin: Secondary | ICD-10-CM

## 2023-10-14 DIAGNOSIS — M79604 Pain in right leg: Secondary | ICD-10-CM | POA: Diagnosis not present

## 2023-10-14 DIAGNOSIS — M5416 Radiculopathy, lumbar region: Secondary | ICD-10-CM | POA: Diagnosis not present

## 2023-10-14 DIAGNOSIS — R200 Anesthesia of skin: Secondary | ICD-10-CM | POA: Diagnosis not present

## 2023-10-21 ENCOUNTER — Ambulatory Visit: Payer: Self-pay | Admitting: Neurology

## 2023-10-21 NOTE — Progress Notes (Signed)
 Please call patient and if he accepts, order referral to [facility] for lumbar spinal stenosis: Aaron Frey, you have severe spinal stenosis in the low back. This means due to arthritis the canal where your nerve roots are passing through is being squeezed which can cause many symptoms in the legs. I think you should be evaluated by a surgeon, if you are OK with that I can refer you to [facility] I will have my staff call you thanks.

## 2023-10-21 NOTE — Telephone Encounter (Signed)
 LVM for patient to call back and discuss test results

## 2023-10-21 NOTE — Telephone Encounter (Signed)
-----   Message from Onetha KATHEE Epp sent at 10/21/2023  8:06 AM EDT ----- Please call patient and if he accepts, order referral to [facility] for lumbar spinal stenosis: Aaron Frey, you have severe spinal stenosis in the low back. This means due to arthritis the  canal where your nerve roots are passing through is being squeezed which can cause many symptoms in the legs. I think you should be evaluated by a surgeon, if you are OK with that I can refer you to  [facility] I will have my staff call you thanks. ----- Message ----- From: Margaret Eduard SAUNDERS, MD Sent: 10/16/2023  10:45 AM EDT To: Onetha KATHEE Epp, MD

## 2023-10-22 ENCOUNTER — Telehealth: Payer: 59 | Admitting: Adult Health

## 2023-10-23 ENCOUNTER — Telehealth: Payer: Self-pay

## 2023-10-23 ENCOUNTER — Other Ambulatory Visit: Payer: Self-pay | Admitting: Neurology

## 2023-10-23 DIAGNOSIS — M79604 Pain in right leg: Secondary | ICD-10-CM

## 2023-10-23 DIAGNOSIS — M48061 Spinal stenosis, lumbar region without neurogenic claudication: Secondary | ICD-10-CM

## 2023-10-23 DIAGNOSIS — M5417 Radiculopathy, lumbosacral region: Secondary | ICD-10-CM

## 2023-10-23 NOTE — Telephone Encounter (Signed)
 Spoke with patient. He is amenable to proceeding with referral to neurosurgery for evaluation of his severe spinal stenosis. He will watch for a call with an appt within 2 weeks.

## 2023-10-23 NOTE — Telephone Encounter (Signed)
 Referral sent to [facility] via fax and email  (P) [phone] (F) [phone] Email - [email or web address]  Unsure if Dr Ines wanted this referral sent as urgent. Bethany RN called and left a vm for her as she left the office. Sent referral and can call on monday to ask to expedite if needed

## 2023-11-07 ENCOUNTER — Other Ambulatory Visit: Payer: Self-pay | Admitting: Family Medicine

## 2023-11-07 DIAGNOSIS — M47812 Spondylosis without myelopathy or radiculopathy, cervical region: Secondary | ICD-10-CM

## 2023-11-11 DIAGNOSIS — R112 Nausea with vomiting, unspecified: Secondary | ICD-10-CM | POA: Insufficient documentation

## 2023-11-17 ENCOUNTER — Other Ambulatory Visit: Payer: Self-pay

## 2023-11-17 MED ORDER — SILDENAFIL CITRATE 100 MG PO TABS: 50.0000 mg | ORAL_TABLET | Freq: Every day | ORAL | 0 refills | Status: DC | PRN

## 2023-11-19 ENCOUNTER — Other Ambulatory Visit: Payer: Self-pay

## 2023-11-19 MED ORDER — GABAPENTIN 300 MG PO CAPS: 300.0000 mg | ORAL_CAPSULE | Freq: Every day | ORAL | 0 refills | Status: DC
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
## 2023-11-20 ENCOUNTER — Ambulatory Visit: Admitting: Rehabilitative and Restorative Service Providers"

## 2023-12-09 ENCOUNTER — Other Ambulatory Visit: Payer: Self-pay

## 2023-12-14 DIAGNOSIS — C61 Malignant neoplasm of prostate: Secondary | ICD-10-CM | POA: Insufficient documentation

## 2023-12-23 ENCOUNTER — Encounter: Payer: Self-pay | Admitting: Sports Medicine
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
## 2024-01-09 ENCOUNTER — Telehealth: Payer: Self-pay | Admitting: Pharmacist

## 2024-01-09 ENCOUNTER — Ambulatory Visit: Admitting: Adult Health

## 2024-01-09 ENCOUNTER — Encounter: Payer: Self-pay | Admitting: Adult Health

## 2024-01-09 VITALS — BP 136/76 | HR 87 | Ht 72.0 in | Wt 226.0 lb

## 2024-01-09 DIAGNOSIS — G43709 Chronic migraine without aura, not intractable, without status migrainosus: Secondary | ICD-10-CM

## 2024-01-09 MED ORDER — QULIPTA 60 MG PO TABS: 60.0000 mg | ORAL_TABLET | Freq: Every day | ORAL | 3 refills | Status: AC

## 2024-01-09 NOTE — Telephone Encounter (Signed)
 Clinical questions have been answered and PA submitted. PA currently Pending. Please be advised that most companies allow up to 30 days to make a decision. We will advise when a determination has been made, or follow up in 1 week.   Please reach out to our team, Rx Prior Auth Pool, if you haven't heard back in a week.

## 2024-01-09 NOTE — Telephone Encounter (Signed)
 Pharmacy Patient Advocate Encounter  Received notification from [employer] that Prior Authorization for Qulipta  60mg  has been APPROVED from 01/09/2024 to 01/08/2026   PA #/Case ID/Reference #: [id number]

## 2024-01-09 NOTE — Patient Instructions (Addendum)
Your Plan:  Continue Qulipta  Continue Nurtec  If your symptoms worsen or you develop new symptoms please let us know.    Thank you for coming to see [place] at [facility]. I hope we have been able to provide you high quality care today.  You may receive a patient satisfaction survey over the next few weeks. We would appreciate your feedback and comments so that we may continue to improve ourselves and the health of our patients.

## 2024-01-09 NOTE — Telephone Encounter (Signed)
 Pharmacy Patient Advocate Encounter   Received notification from Patient Pharmacy that prior authorization for Qulipta  60MG  tablets is required/requested.   Insurance verification completed.   The patient is insured through [facility] .   Per test claim: PA required; PA started via CoverMyMeds. KEY [id number] . Waiting for clinical questions to populate.

## 2024-01-11 ENCOUNTER — Other Ambulatory Visit: Payer: Self-pay | Admitting: Family Medicine

## 2024-01-11 DIAGNOSIS — M47812 Spondylosis without myelopathy or radiculopathy, cervical region: Secondary | ICD-10-CM

## 2024-01-13 ENCOUNTER — Other Ambulatory Visit: Payer: Self-pay | Admitting: Orthopedic Surgery

## 2024-01-13 DIAGNOSIS — M5416 Radiculopathy, lumbar region: Secondary | ICD-10-CM

## 2024-01-18 ENCOUNTER — Ambulatory Visit

## 2024-01-18 DIAGNOSIS — M5416 Radiculopathy, lumbar region: Secondary | ICD-10-CM

## 2024-02-02 ENCOUNTER — Ambulatory Visit: Admitting: Orthopedic Surgery

## 2024-02-11 ENCOUNTER — Encounter: Payer: Self-pay | Admitting: Family Medicine

## 2024-02-11 ENCOUNTER — Ambulatory Visit: Admitting: Family Medicine

## 2024-02-11 VITALS — BP 139/78 | HR 80 | Ht 72.0 in | Wt 229.0 lb

## 2024-02-11 DIAGNOSIS — M48061 Spinal stenosis, lumbar region without neurogenic claudication: Secondary | ICD-10-CM | POA: Insufficient documentation

## 2024-02-11 DIAGNOSIS — Z23 Encounter for immunization: Secondary | ICD-10-CM

## 2024-02-11 DIAGNOSIS — G43709 Chronic migraine without aura, not intractable, without status migrainosus: Secondary | ICD-10-CM | POA: Diagnosis not present

## 2024-02-11 NOTE — Progress Notes (Signed)
 Aaron Frey - 63 y.o. male MRN [id number]  Date of birth: 08-30-1960  Subjective Chief Complaint  Patient presents with   FMLA    HPI Aaron Frey is a 63 y.o. male here today for follow up visit.   History of frequent migraines.  Has FMLA in place for this but need renewal of this.  He is also seeing neurology for this.  Overall stable but he does have breakthrough episodes a few times each month.  These episodes are debilitating and require time from work.   ROS:  A comprehensive ROS was completed and negative except as noted per HPI  Allergies  Allergen Reactions   Atacand  [Candesartan ]     Tingling, not able to  tolerate.    Past Medical History:  Diagnosis Date   C2 cervical fracture (HCC) 12/29/2013   C5 vertebral fracture (HCC) 12/29/2013   Class 1 obesity due to excess calories without serious comorbidity with body mass index (BMI) of 32.0 to 32.9 in adult 08/30/2016   Concussion 12/29/2013   Former tobacco use    Hypertension    Hypertriglyceridemia without hypercholesterolemia 09/03/2016   Lumbar transverse process fracture (HCC) 12/29/2013   Migraine    Prostate cancer (HCC)    Sacrum and coccyx fracture (HCC) 12/29/2013    Past Surgical History:  Procedure Laterality Date   ACHILLES TENDON REPAIR     lump removed for chest Right    when he was a child   PENILE PROSTHESIS IMPLANT     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   Procedure: RIGHT TOTAL HIP ARTHROPLASTY;  Surgeon: Vernetta Lonni GRADE, MD;  Location: [facility] OR;  Service: Orthopedics;  Laterality: Right;  RNFA    Social History   Socioeconomic History   Marital status: Single    Spouse name: Not on file   Number of children: 4   Years of education: Not on file   Highest education level: Not on file  Occupational History   Not on file  Tobacco Use   Smoking status: Former    Current packs/day: 0.00    Types: Cigarettes    Quit date: 2015    Years since quitting: 10.8   Smokeless  tobacco: Never   Tobacco comments:    3-4 cigarettes/day  Vaping Use   Vaping status: Never Used  Substance and Sexual Activity   Alcohol use: Yes    Comment: Occasionally- monthly   Drug use: No    Types: Marijuana   Sexual activity: Not on file  Other Topics Concern   Not on file  Social History Narrative   Lives alone   Right handed   Caffeine: 1 cup on 5 days of the week   Social Drivers of Health   Financial Resource Strain: Low Risk  (02/09/2024)   Overall Financial Resource Strain (CARDIA)    Difficulty of Paying Living Expenses: Not hard at all  Food Insecurity: No Food Insecurity (02/09/2024)   Hunger Vital Sign    Worried About Running Out of Food in the Last Year: Never true    Ran Out of Food in the Last Year: Never true  Transportation Needs: No Transportation Needs (02/09/2024)   PRAPARE - Administrator, Civil Service (Medical): No    Lack of Transportation (Non-Medical): No  Physical Activity: Insufficiently Active (02/09/2024)   Exercise Vital Sign    Days of Exercise per Week: 2 days    Minutes of Exercise per Session: 20 min  Stress: Stress Concern  Present (02/09/2024)   [employer] of Occupational Health - Occupational Stress Questionnaire    Feeling of Stress: To some extent  Social Connections: Socially Isolated (02/09/2024)   Social Connection and Isolation Panel    Frequency of Communication with Friends and Family: Once a week    Frequency of Social Gatherings with Friends and Family: Once a week    Attends Religious Services: Patient declined    Database administrator or Organizations: No    Attends Engineer, structural: Not on file    Marital Status: Never married    Family History  Problem Relation Age of Onset   Cancer Mother    Stroke Mother    Dementia Mother    Migraines Mother    ALS Father    Migraines Daughter    Migraines Son     Health Maintenance  Topic Date Due   HIV Screening  Never done    Zoster Vaccines- Shingrix  (2 of 2) 12/02/2016   Pneumococcal Vaccine: 50+ Years (1 of 1 - PCV) 02/10/2025 (Originally 01/03/2013)   COVID-19 Vaccine (1) 02/26/2025 (Originally 01/04/1966)   Fecal DNA (Cologuard)  07/01/2024   DTaP/Tdap/Td (3 - Td or Tdap) 02/10/2034   Influenza Vaccine  Completed   Hepatitis C Screening  Completed   Hepatitis B Vaccines 19-59 Average Risk  Aged Out   HPV VACCINES  Aged Out   Meningococcal B Vaccine  Aged Out     ----------------------------------------------------------------------------------------------------------------------------------------------------------------------------------------------------------------- Physical Exam BP 139/78   Pulse 80   Ht 6' (1.829 m)   Wt 229 lb (103.9 kg)   SpO2 98%   BMI 31.06 kg/m   Physical Exam Constitutional:      Appearance: Normal appearance.  Cardiovascular:     Rate and Rhythm: Normal rate and regular rhythm.  Pulmonary:     Effort: Pulmonary effort is normal.     Breath sounds: Normal breath sounds.  Neurological:     General: No focal deficit present.     Mental Status: He is alert.  Psychiatric:        Mood and Affect: Mood normal.        Behavior: Behavior normal.     ------------------------------------------------------------------------------------------------------------------------------------------------------------------------------------------------------------------- Assessment and Plan  Migraine Overall stable.  Seeing neurology for management.  He does have some breakthrough episodes a few times per month.  Updated FMLA paperwork completed.    No orders of the defined types were placed in this encounter.   No follow-ups on file.

## 2024-02-11 NOTE — Assessment & Plan Note (Signed)
 Overall stable.  Seeing neurology for management.  He does have some breakthrough episodes a few times per month.  Updated FMLA paperwork completed.

## 2024-02-23 ENCOUNTER — Encounter: Payer: Self-pay | Admitting: Radiology

## 2024-02-25 ENCOUNTER — Telehealth: Payer: Self-pay

## 2024-02-25 NOTE — Telephone Encounter (Signed)
 Copied from CRM [id number]. Topic: General - Other >> Feb 25, 2024 12:17 PM Delon T wrote: Reason for CRM: need help with FMLA paperwork- can it be dropped off or need an appt- [phone]

## 2024-02-27 ENCOUNTER — Ambulatory Visit (INDEPENDENT_AMBULATORY_CARE_PROVIDER_SITE_OTHER)

## 2024-02-27 ENCOUNTER — Telehealth: Payer: Self-pay

## 2024-02-27 VITALS — Temp 98.5°F

## 2024-02-27 DIAGNOSIS — Z23 Encounter for immunization: Secondary | ICD-10-CM

## 2024-02-27 NOTE — Telephone Encounter (Signed)
 This task has been completed as requested. Please review other TE for additional information. Patient notified of the update. Per the patient, he will drop off the forms during his appointment today with the provider. No other inquiries during the call. No further action needed.

## 2024-02-27 NOTE — Telephone Encounter (Signed)
 Patient dropped off Leave paperwork to be completed by PCP, forms placed in [place] box, thanks.

## 2024-02-28 ENCOUNTER — Other Ambulatory Visit: Payer: Self-pay | Admitting: Family Medicine

## 2024-03-05 ENCOUNTER — Other Ambulatory Visit: Payer: Self-pay | Admitting: Family Medicine

## 2024-03-05 DIAGNOSIS — M79671 Pain in right foot: Secondary | ICD-10-CM

## 2024-03-05 NOTE — Telephone Encounter (Signed)
 Copied from CRM #[id number]. Topic: Clinical - Medication Refill >> Mar 05, 2024 12:53 PM Joesph B wrote: Medication:  gabapentin  (NEURONTIN ) 300 MG capsule  Has the patient contacted their pharmacy? Yes (Agent: If no, request that the patient contact the pharmacy for the refill. If patient does not wish to contact the pharmacy document the reason why and proceed with request.) (Agent: If yes, when and what did the pharmacy advise?)  This is the patient's preferred pharmacy:  [id number] - [place], [place] - [address] AT [facility] OF [address] [place] [address] Phone: [phone] Fax: [phone]  Is this the correct pharmacy for this prescription? Yes If no, delete pharmacy and type the correct one.   Has the prescription been filled recently? Yes  Is the patient out of the medication? Yes  Has the patient been seen for an appointment in the last year OR does the patient have an upcoming appointment? Yes  Can we respond through MyChart? Yes  Agent: Please be advised that Rx refills may take up to 3 business days. We ask that you follow-up with your pharmacy.

## 2024-03-08 MED ORDER — GABAPENTIN 300 MG PO CAPS
300.0000 mg | ORAL_CAPSULE | Freq: Every day | ORAL | 0 refills | Status: AC
Start: 2024-03-08 — End: ?

## 2024-03-27 ENCOUNTER — Other Ambulatory Visit: Payer: Self-pay | Admitting: Family Medicine

## 2024-03-27 DIAGNOSIS — M47812 Spondylosis without myelopathy or radiculopathy, cervical region: Secondary | ICD-10-CM

## 2024-04-13 ENCOUNTER — Other Ambulatory Visit: Payer: Self-pay

## 2024-04-13 MED ORDER — AMLODIPINE BESYLATE 10 MG PO TABS: 10.0000 mg | ORAL_TABLET | Freq: Every day | ORAL | 0 refills | Status: DC

## 2024-04-19 ENCOUNTER — Telehealth: Payer: Self-pay

## 2024-04-19 ENCOUNTER — Ambulatory Visit (INDEPENDENT_AMBULATORY_CARE_PROVIDER_SITE_OTHER)

## 2024-04-19 VITALS — BP 153/85 | HR 75 | Resp 18 | Ht 72.0 in | Wt 229.0 lb

## 2024-04-19 DIAGNOSIS — I10 Essential (primary) hypertension: Secondary | ICD-10-CM | POA: Diagnosis not present

## 2024-04-19 MED ORDER — AMLODIPINE BESYLATE 10 MG PO TABS: 10.0000 mg | ORAL_TABLET | Freq: Every day | ORAL | 1 refills | Status: AC

## 2024-04-19 NOTE — Progress Notes (Signed)
" ° °  Subjective:    Patient ID: Aaron Frey, male    DOB: September 22, 1960, 63 y.o.   MRN: [id number]  HPI  Patient is coming in for a BP check. He is out of refills and was to f/u with a nurse visit back in April. Currently takes amlodipine  10 mg. States he is supposed to have surgery in Jan. Sometimes and stopped taking the medication so he would have enough for surgery. Advised medication should be taken on a consistent basis. Denies CP, SOB, headaches, or vision changes. Patient is asking for refills on his amlodipine  and his Flomax. Pt does have a f/u appointment scheduled for tomorrow with urology, but said that I would send the request to PCP.  Review of Systems     Objective:   Physical Exam        Assessment & Plan:   Patients first BP reading is 175/98. His second is 153/85 reported to Dr. Alvia who would like patient start back taking his medication consistently and RTC in 2 weeks. Appointment scheduled today and amlodipine  also sent in per Dr. Alvia. "

## 2024-04-19 NOTE — Telephone Encounter (Signed)
 Copied from CRM [id number]. Topic: Clinical - Prescription Issue >> Apr 19, 2024 11:04 AM Joesph NOVAK wrote: Reason for CRM: patient states he went to pick up his medication and he's being told he has to pay $38 and never paid for his medication. A 30 day supply was sent over and not the 90 day.   amLODipine  (NORVASC ) 10 MG tablet   He is requesting for the prescription to be re sent to pharmacy because its no longer in the system, I advised him per notes he needs a appointment as well. Please fu.

## 2024-04-19 NOTE — Telephone Encounter (Signed)
 Spoke with the patient and advised per his last OV for HTN he was supposed to f/u with a nurse visit. Patient is coming in today for a BP check. For refills or any medication changes if necessary.

## 2024-04-21 ENCOUNTER — Ambulatory Visit: Admitting: Family Medicine

## 2024-05-03 ENCOUNTER — Ambulatory Visit (INDEPENDENT_AMBULATORY_CARE_PROVIDER_SITE_OTHER)

## 2024-05-03 VITALS — BP 140/85 | HR 74 | Resp 20 | Ht 72.0 in

## 2024-05-03 DIAGNOSIS — I10 Essential (primary) hypertension: Secondary | ICD-10-CM | POA: Diagnosis not present

## 2024-05-03 NOTE — Progress Notes (Signed)
" ° °  Subjective:    Patient ID: Aaron Frey, male    DOB: [birth year], 64 y.o.   MRN: [id number]  HPI  Patient is here for a 2 week BP follow up. Patient currently takes amlodipine  10 mg. He just recently restarted it due to being out of the medication for awhile. Denies CP, SOB, headaches, or vision changes. Patient does report having a migraine today also states he has been very busy and not staying hydrated as he should. Reports taking BP medication around 830 am this morning.  Review of Systems     Objective:   Physical Exam        Assessment & Plan:   Patients first BP is 167/83. His second is 140/85. reported to Dr. Alvia who would like the patient to cont. Regimen, monitor at home at schedule a 6 month f/u with him from his last OV (aound 08/11/24) "

## 2024-05-06 ENCOUNTER — Other Ambulatory Visit: Payer: Self-pay | Admitting: Orthopedic Surgery

## 2024-05-18 NOTE — Progress Notes (Signed)
 Surgical Instructions   Your procedure is scheduled on Wednesday, February 4th, 2026. Report to [facility] Entrance A at 6:30 A.M., then check in with the Admitting office. Any questions or running late day of surgery: call [phone]  Questions prior to your surgery date: call [phone], Monday-Friday, 8am-4pm. If you experience any cold or flu symptoms such as cough, fever, chills, shortness of breath, etc. between now and your scheduled surgery, please notify us  at the above number.     Remember:  Do not eat after midnight the night before your surgery  You may drink clear liquids until 5:30 the morning of your surgery.   Clear liquids allowed are: Water, Non-Citrus Juices (without pulp), Carbonated Beverages, Clear Tea (no milk, honey, etc.), Black Coffee Only (NO MILK, CREAM OR POWDERED CREAMER of any kind), and Gatorade.  Patient Instructions  The night before surgery:  No food after midnight. ONLY clear liquids after midnight  The day of surgery (if you do NOT have diabetes):  Drink ONE (1) Pre-Surgery Clear Ensure by 5:30 the morning of surgery. Drink in one sitting. Do not sip.  This drink was given to you during your hospital  pre-op appointment visit.  Nothing else to drink after completing the  Pre-Surgery Clear Ensure.          If you have questions, please contact your surgeons office.     Take these medicines the morning of surgery with A SIP OF WATER: Amlodipine  (Norvasc ) Atogepant  (Qulipta )   May take these medicines IF NEEDED: Gabapentin  (Neurontin ) Methocarbamol  (Robaxin ) Rimegepant Sulfate (Nurtec)    One week prior to surgery, STOP taking any Aspirin  (unless otherwise instructed by your surgeon) Aleve , Naproxen , Ibuprofen, Motrin, Advil, Goody's, BC's, all herbal medications, fish oil, and non-prescription vitamins.                     Do NOT Smoke (Tobacco/Vaping) for 24 hours prior to your procedure.  If you use a CPAP at night,  you may bring your mask/headgear for your overnight stay.   You will be asked to remove any contacts, glasses, piercing's, hearing aid's, dentures/partials prior to surgery. Please bring cases for these items if needed.    Your surgeon will determine if you are to be admitted or discharged the same day.  Patients discharged the day of surgery will not be allowed to drive home, and someone needs to stay with them for 24 hours.  SURGICAL WAITING ROOM VISITATION Patients may have no more than 2 support people in the waiting area - these visitors may rotate.   Pre-op nurse will coordinate an appropriate time for 2 ADULT support persons, who may not rotate, to accompany patient in pre-op.  Children under the age of 62 must have an adult with them who is not the patient and must remain in the main waiting area with an adult.  If the patient needs to stay at the hospital during part of their recovery, the visitor guidelines for inpatient rooms apply.  Please refer to the [facility] website for the visitor guidelines for any additional information.   If you received a COVID test during your pre-op visit  it is requested that you wear a mask when out in public, stay away from anyone that may not be feeling well and notify your surgeon if you develop symptoms. If you have been in contact with anyone that has tested positive in the last 10 days please notify you surgeon.  Pre-operative 4 CHG Bathing Instructions   You can play a key role in reducing the risk of infection after surgery. Your skin needs to be as free of germs as possible. You can reduce the number of germs on your skin by washing with CHG (chlorhexidine  gluconate) soap before surgery. CHG is an antiseptic soap that kills germs and continues to kill germs even after washing.   DO NOT use if you have an allergy to chlorhexidine /CHG or antibacterial soaps. If your skin becomes reddened or irritated, stop using the CHG and notify one of  our RNs at [phone].   Please shower with the CHG soap starting 4 days before surgery using the following schedule:     Please keep in mind the following:  DO NOT shave, including legs and underarms, starting the day of your first shower.   You may shave your face at any point before/day of surgery.  Place clean sheets on your bed the day you start using CHG soap. Use a clean washcloth (not used since being washed) for each shower. DO NOT sleep with pets once you start using the CHG.   CHG Shower Instructions:  Wash your face and private area with normal soap. If you choose to wash your hair, wash first with your normal shampoo.  After you use shampoo/soap, rinse your hair and body thoroughly to remove shampoo/soap residue.  Turn the water OFF and apply  bottle of CHG soap to a CLEAN washcloth.  Apply CHG soap ONLY FROM YOUR NECK DOWN TO YOUR TOES (washing for 3-5 minutes)  DO NOT use CHG soap on face, private areas, open wounds, or sores.  Pay special attention to the area where your surgery is being performed.  If you are having back surgery, having someone wash your back for you may be helpful. Wait 2 minutes after CHG soap is applied, then you may rinse off the CHG soap.  Pat dry with a clean towel  Put on clean clothes/pajamas   If you choose to wear lotion, please use ONLY the CHG-compatible lotions that are listed below.  Additional instructions for the day of surgery:  If you choose, you may shower the morning of surgery with an antibacterial soap.  DO NOT APPLY any lotions, deodorants, cologne, or perfumes.   Do not bring valuables to the hospital. [facility] is not responsible for any belongings/valuables. Do not wear nail polish, gel polish, artificial nails, or any other type of covering on natural nails (fingers and toes) Do not wear jewelry or makeup Put on clean/comfortable clothes.  Please brush your teeth.  Ask your nurse before applying any prescription  medications to the skin.     CHG Compatible Lotions   Aveeno Moisturizing lotion  Cetaphil Moisturizing Cream  Cetaphil Moisturizing Lotion  Clairol Herbal Essence Moisturizing Lotion, Dry Skin  Clairol Herbal Essence Moisturizing Lotion, Extra Dry Skin  Clairol Herbal Essence Moisturizing Lotion, Normal Skin  Curel Age Defying Therapeutic Moisturizing Lotion with Alpha Hydroxy  Curel Extreme Care Body Lotion  Curel Soothing Hands Moisturizing Hand Lotion  Curel Therapeutic Moisturizing Cream, Fragrance-Free  Curel Therapeutic Moisturizing Lotion, Fragrance-Free  Curel Therapeutic Moisturizing Lotion, Original Formula  Eucerin Daily Replenishing Lotion  Eucerin Dry Skin Therapy Plus Alpha Hydroxy Crme  Eucerin Dry Skin Therapy Plus Alpha Hydroxy Lotion  Eucerin Original Crme  Eucerin Original Lotion  Eucerin Plus Crme Eucerin Plus Lotion  Eucerin TriLipid Replenishing Lotion  Keri Anti-Bacterial Hand Lotion  Keri Deep Conditioning Original Lotion Dry  Skin Formula Softly Scented  Keri Deep Conditioning Original Lotion, Fragrance Free Sensitive Skin Formula  Keri Lotion Fast Absorbing Fragrance Free Sensitive Skin Formula  Keri Lotion Fast Absorbing Softly Scented Dry Skin Formula  Keri Original Lotion  Keri Skin Renewal Lotion Keri Silky Smooth Lotion  Keri Silky Smooth Sensitive Skin Lotion  Nivea Body Creamy Conditioning Oil  Nivea Body Extra Enriched Lotion  Nivea Body Original Lotion  Nivea Body Sheer Moisturizing Lotion Nivea Crme  Nivea Skin Firming Lotion  NutraDerm 30 Skin Lotion  NutraDerm Skin Lotion  NutraDerm Therapeutic Skin Cream  NutraDerm Therapeutic Skin Lotion  ProShield Protective Hand Cream  Provon moisturizing lotion  Please read over the following fact sheets that you were given.

## 2024-05-19 ENCOUNTER — Encounter (HOSPITAL_COMMUNITY): Payer: Self-pay

## 2024-05-19 ENCOUNTER — Encounter (HOSPITAL_COMMUNITY)
Admission: RE | Admit: 2024-05-19 | Discharge: 2024-05-19 | Disposition: A | Discharge status: Home / Self Care | Source: Ambulatory Visit | Attending: Orthopedic Surgery | Admitting: Orthopedic Surgery

## 2024-05-19 ENCOUNTER — Other Ambulatory Visit: Payer: Self-pay

## 2024-05-19 VITALS — BP 136/79 | HR 73 | Temp 97.9°F | Resp 19 | Ht 73.0 in | Wt 226.2 lb

## 2024-05-19 DIAGNOSIS — Z01818 Encounter for other preprocedural examination: Secondary | ICD-10-CM | POA: Diagnosis present

## 2024-05-19 HISTORY — DX: Unspecified osteoarthritis, unspecified site: M19.90

## 2024-05-19 LAB — BASIC METABOLIC PANEL WITH GFR
Anion gap: 12 (ref 5–15)
BUN: 15 mg/dL (ref 8–23)
CO2: 28 mmol/L (ref 22–32)
Calcium: 9.7 mg/dL (ref 8.9–10.3)
Chloride: 99 mmol/L (ref 98–111)
Creatinine, Ser: 1.28 mg/dL — ABNORMAL HIGH (ref 0.61–1.24)
GFR, Estimated: >60 mL/min
Glucose, Bld: 100 mg/dL — ABNORMAL HIGH (ref 70–99)
Potassium: 4.3 mmol/L (ref 3.5–5.1)
Sodium: 139 mmol/L (ref 135–145)

## 2024-05-19 LAB — CBC
HCT: 44.6 % (ref 39.0–52.0)
Hemoglobin: 14.8 g/dL (ref 13.0–17.0)
MCH: 27.2 pg (ref 26.0–34.0)
MCHC: 33.2 g/dL (ref 30.0–36.0)
MCV: 81.8 fL (ref 80.0–100.0)
Platelets: 291 10*3/uL (ref 150–400)
RBC: 5.45 MIL/uL (ref 4.22–5.81)
RDW: 13.7 % (ref 11.5–15.5)
WBC: 5.4 10*3/uL (ref 4.0–10.5)
nRBC: 0 % (ref 0.0–0.2)

## 2024-05-19 LAB — TYPE AND SCREEN
ABO/RH(D): A POS
Antibody Screen: NEGATIVE

## 2024-05-19 LAB — SURGICAL PCR SCREEN
MRSA, PCR: NEGATIVE
Staphylococcus aureus: NEGATIVE

## 2024-05-19 NOTE — Progress Notes (Addendum)
 PCP - Dr. Velma Ku Cardiologist - denies  PPM/ICD - denies Device Orders - n/a Rep Notified - n/a  Chest x-ray - denies EKG - 05/19/24 Stress Test - n/a ECHO - n/a Cardiac Cath - n/a  Sleep Study - denies CPAP - n/a  No DM  Last dose of GLP1 agonist-  n/a GLP1 instructions: n/a  Blood Thinner Instructions: n/a Aspirin  Instructions: n/a  ERAS Protcol -clears until 0530 PRE-SURGERY Ensure or G2- Ensure as ordered  COVID TEST- n/a   Anesthesia review: n/a  Patient denies shortness of breath, fever, cough and chest pain at PAT appointment   All instructions explained to the patient, with a verbal understanding of the material. Patient agrees to go over the instructions while at home for a better understanding. Patient also instructed to self quarantine after being tested for COVID-19. The opportunity to ask questions was provided.  Patient states that he has no one to drive him home or stay with him after the procedure.  It was explained to the patient that the surgeon makes the decision if the patient will be spending the night or if will be discharged the same day.  The patient was informed that if discharged the same day, he has to have someone drive him home and stay with him for 24 hours.  [name] at Dr. Andrey office was notified of the situation at 1137 on 05/19/24.  She stated that she would discuss with Dr. Beuford and return my call.    Addendum: 1240: Dr. Beuford aware.

## 2024-05-25 NOTE — Anesthesia Preprocedure Evaluation (Addendum)
"                                    Anesthesia Evaluation  Patient identified by MRN, date of birth, ID band Patient awake    Reviewed: Allergy & Precautions, NPO status , Patient's Chart, lab work & pertinent test results  History of Anesthesia Complications (+) PONV and history of anesthetic complications  Airway Mallampati: III  TM Distance: >3 FB Neck ROM: Full    Dental no notable dental hx. (+) Teeth Intact, Dental Advisory Given   Pulmonary former smoker   Pulmonary exam normal breath sounds clear to auscultation       Cardiovascular hypertension, Pt. on medications Normal cardiovascular exam Rhythm:Regular Rate:Normal     Neuro/Psych  Headaches  negative psych ROS   GI/Hepatic negative GI ROS, Neg liver ROS,,,  Endo/Other  negative endocrine ROS    Renal/GU negative Renal ROS  negative genitourinary   Musculoskeletal  (+) Arthritis ,  H/o cervical injury   Abdominal   Peds  Hematology negative hematology ROS (+)   Anesthesia Other Findings   Reproductive/Obstetrics                              Anesthesia Physical Anesthesia Plan  ASA: 2  Anesthesia Plan: General   Post-op Pain Management: Tylenol  PO (pre-op)*, Ketamine  IV* and Dilaudid  IV   Induction: Intravenous  PONV Risk Score and Plan: 3 and Midazolam , Dexamethasone  and Ondansetron   Airway Management Planned: Oral ETT and Video Laryngoscope Planned  Additional Equipment:   Intra-op Plan:   Post-operative Plan: Extubation in OR  Informed Consent: I have reviewed the patients History and Physical, chart, labs and discussed the procedure including the risks, benefits and alternatives for the proposed anesthesia with the patient or authorized representative who has indicated his/her understanding and acceptance.     Dental advisory given  Plan Discussed with: CRNA  Anesthesia Plan Comments: (2 IVs)         Anesthesia Quick Evaluation  "

## 2024-05-26 ENCOUNTER — Ambulatory Visit (HOSPITAL_COMMUNITY)

## 2024-05-26 ENCOUNTER — Observation Stay (HOSPITAL_COMMUNITY)
Admission: RE | Admit: 2024-05-26 | Discharge: 2024-05-27 | Disposition: A | Source: Home / Self Care | Attending: Orthopedic Surgery | Admitting: Orthopedic Surgery

## 2024-05-26 ENCOUNTER — Encounter (HOSPITAL_COMMUNITY): Admitting: Anesthesiology

## 2024-05-26 ENCOUNTER — Other Ambulatory Visit: Payer: Self-pay

## 2024-05-26 ENCOUNTER — Encounter (HOSPITAL_COMMUNITY): Admission: RE | Disposition: A | Payer: Self-pay | Source: Home / Self Care | Attending: Orthopedic Surgery

## 2024-05-26 ENCOUNTER — Encounter (HOSPITAL_COMMUNITY): Payer: Self-pay | Admitting: Orthopedic Surgery

## 2024-05-26 DIAGNOSIS — M5416 Radiculopathy, lumbar region: Principal | ICD-10-CM | POA: Diagnosis present

## 2024-05-26 MED ORDER — MIDAZOLAM HCL 2 MG/2ML IJ SOLN
INTRAMUSCULAR | Status: AC
  Filled 2024-05-26: qty 2

## 2024-05-26 MED ORDER — PROPOFOL 10 MG/ML IV BOLUS
INTRAVENOUS | Status: AC
  Filled 2024-05-26: qty 20

## 2024-05-26 MED ORDER — SUGAMMADEX SODIUM 200 MG/2ML IV SOLN
INTRAVENOUS | Status: DC | PRN
  Administered 2024-05-26: 400 mg via INTRAVENOUS

## 2024-05-26 MED ORDER — FENTANYL CITRATE (PF) 100 MCG/2ML IJ SOLN
INTRAMUSCULAR | Status: DC | PRN
  Administered 2024-05-26 (×2): 50 ug via INTRAVENOUS

## 2024-05-26 MED ORDER — OXYCODONE HCL 5 MG PO TABS: 5.0000 mg | ORAL_TABLET | Freq: Once | ORAL | Status: DC | PRN

## 2024-05-26 MED ORDER — AMLODIPINE BESYLATE 10 MG PO TABS: 10.0000 mg | ORAL_TABLET | Freq: Every day | ORAL | Status: DC

## 2024-05-26 MED ORDER — SODIUM CHLORIDE 0.9% FLUSH: 3.0000 mL | INTRAVENOUS | Status: DC | PRN

## 2024-05-26 MED ORDER — SODIUM CHLORIDE 0.9% FLUSH
3.0000 mL | Freq: Two times a day (BID) | INTRAVENOUS | Status: DC
  Administered 2024-05-26 (×2): 3 mL via INTRAVENOUS

## 2024-05-26 MED ORDER — PANTOPRAZOLE SODIUM 40 MG IV SOLR: 40.0000 mg | Freq: Every day | INTRAVENOUS | Status: DC

## 2024-05-26 MED ORDER — HYDROMORPHONE HCL 1 MG/ML IJ SOLN
INTRAMUSCULAR | Status: AC
  Filled 2024-05-26: qty 1

## 2024-05-26 MED ORDER — PHENYLEPHRINE HCL-NACL 20-0.9 MG/250ML-% IV SOLN
INTRAVENOUS | Status: AC
  Filled 2024-05-26: qty 250

## 2024-05-26 MED ORDER — POTASSIUM CHLORIDE IN NACL 20-0.9 MEQ/L-% IV SOLN
INTRAVENOUS | Status: DC
  Filled 2024-05-26: qty 1000

## 2024-05-26 MED ORDER — CEFAZOLIN SODIUM-DEXTROSE 2-4 GM/100ML-% IV SOLN
2.0000 g | Freq: Three times a day (TID) | INTRAVENOUS | Status: AC
  Administered 2024-05-26 (×2): 2 g via INTRAVENOUS
  Filled 2024-05-26 (×2): qty 100

## 2024-05-26 MED ORDER — AMISULPRIDE (ANTIEMETIC) 5 MG/2ML IV SOLN: 10.0000 mg | Freq: Once | INTRAVENOUS | Status: DC | PRN

## 2024-05-26 MED ORDER — ACETAMINOPHEN 650 MG RE SUPP: 650.0000 mg | RECTAL | Status: DC | PRN

## 2024-05-26 MED ORDER — PHENOL 1.4 % MT LIQD: 1.0000 | OROMUCOSAL | Status: DC | PRN

## 2024-05-26 MED ORDER — SODIUM CHLORIDE 0.9 % IV SOLN: 250.0000 mL | INTRAVENOUS | Status: AC

## 2024-05-26 MED ORDER — MINERAL OIL LIGHT OIL
TOPICAL_OIL | Status: DC | PRN
  Administered 2024-05-26: 1 via TOPICAL

## 2024-05-26 MED ORDER — POVIDONE-IODINE 7.5 % EX SOLN: Freq: Once | CUTANEOUS | Status: AC

## 2024-05-26 MED ORDER — BUPIVACAINE-EPINEPHRINE (PF) 0.25% -1:200000 IJ SOLN
INTRAMUSCULAR | Status: AC
  Filled 2024-05-26: qty 30

## 2024-05-26 MED ORDER — THROMBIN 20000 UNITS EX SOLR
CUTANEOUS | Status: AC
  Filled 2024-05-26: qty 20000

## 2024-05-26 MED ORDER — ZOLPIDEM TARTRATE 5 MG PO TABS: 5.0000 mg | ORAL_TABLET | Freq: Every evening | ORAL | Status: DC | PRN

## 2024-05-26 MED ORDER — LACTATED RINGERS IV SOLN: INTRAVENOUS | Status: DC

## 2024-05-26 MED ORDER — ALBUMIN HUMAN 5 % IV SOLN: INTRAVENOUS | Status: DC | PRN

## 2024-05-26 MED ORDER — BUPIVACAINE LIPOSOME 1.3 % IJ SUSP
INTRAMUSCULAR | Status: AC
  Filled 2024-05-26: qty 20

## 2024-05-26 MED ORDER — ACETAMINOPHEN 500 MG PO TABS
1000.0000 mg | ORAL_TABLET | Freq: Once | ORAL | Status: AC
  Administered 2024-05-26: 1000 mg via ORAL
  Filled 2024-05-26: qty 2

## 2024-05-26 MED ORDER — THROMBIN 20000 UNITS EX KIT
PACK | CUTANEOUS | Status: AC
  Filled 2024-05-26: qty 1

## 2024-05-26 MED ORDER — FENTANYL CITRATE (PF) 100 MCG/2ML IJ SOLN
INTRAMUSCULAR | Status: AC
  Filled 2024-05-26: qty 2

## 2024-05-26 MED ORDER — LIDOCAINE 2% (20 MG/ML) 5 ML SYRINGE
INTRAMUSCULAR | Status: DC | PRN
  Administered 2024-05-26: 100 mg via INTRAVENOUS

## 2024-05-26 MED ORDER — ACETAMINOPHEN 325 MG PO TABS: 650.0000 mg | ORAL_TABLET | ORAL | Status: DC | PRN

## 2024-05-26 MED ORDER — CHLORHEXIDINE GLUCONATE 0.12 % MT SOLN
15.0000 mL | Freq: Once | OROMUCOSAL | Status: AC
  Administered 2024-05-26: 15 mL via OROMUCOSAL
  Filled 2024-05-26: qty 15

## 2024-05-26 MED ORDER — CEFAZOLIN SODIUM-DEXTROSE 2-4 GM/100ML-% IV SOLN
2.0000 g | INTRAVENOUS | Status: AC
  Administered 2024-05-26: 2 g via INTRAVENOUS
  Filled 2024-05-26: qty 100

## 2024-05-26 MED ORDER — OXYCODONE-ACETAMINOPHEN 5-325 MG PO TABS
1.0000 | ORAL_TABLET | ORAL | Status: DC | PRN
  Administered 2024-05-26: 1 via ORAL
  Administered 2024-05-27 (×2): 2 via ORAL
  Filled 2024-05-26 (×2): qty 2
  Filled 2024-05-26: qty 1

## 2024-05-26 MED ORDER — MENTHOL 3 MG MT LOZG: 1.0000 | LOZENGE | OROMUCOSAL | Status: DC | PRN

## 2024-05-26 MED ORDER — HYDROMORPHONE HCL 1 MG/ML IJ SOLN
INTRAMUSCULAR | Status: AC
  Filled 2024-05-26: qty 0.5

## 2024-05-26 MED ORDER — EPHEDRINE SULFATE-NACL 50-0.9 MG/10ML-% IV SOSY
PREFILLED_SYRINGE | INTRAVENOUS | Status: DC | PRN
  Administered 2024-05-26: 5 mg via INTRAVENOUS

## 2024-05-26 MED ORDER — FLEET ENEMA RE ENEM: 1.0000 | ENEMA | Freq: Once | RECTAL | Status: DC | PRN

## 2024-05-26 MED ORDER — BUPIVACAINE-EPINEPHRINE 0.25% -1:200000 IJ SOLN
INTRAMUSCULAR | Status: DC | PRN
  Administered 2024-05-26: 43 mL

## 2024-05-26 MED ORDER — DEXAMETHASONE SODIUM PHOSPHATE 4 MG/ML IJ SOLN
INTRAMUSCULAR | Status: DC | PRN
  Administered 2024-05-26: 8 mg via INTRAVENOUS

## 2024-05-26 MED ORDER — KETAMINE HCL 50 MG/5ML IJ SOSY
PREFILLED_SYRINGE | INTRAMUSCULAR | Status: AC
  Filled 2024-05-26: qty 5

## 2024-05-26 MED ORDER — SENNOSIDES-DOCUSATE SODIUM 8.6-50 MG PO TABS: 1.0000 | ORAL_TABLET | Freq: Every evening | ORAL | Status: DC | PRN

## 2024-05-26 MED ORDER — GABAPENTIN 300 MG PO CAPS: 300.0000 mg | ORAL_CAPSULE | Freq: Every day | ORAL | Status: DC | PRN

## 2024-05-26 MED ORDER — ATOGEPANT 60 MG PO TABS: 60.0000 mg | ORAL_TABLET | Freq: Every day | ORAL | Status: DC

## 2024-05-26 MED ORDER — BISACODYL 5 MG PO TBEC
5.0000 mg | DELAYED_RELEASE_TABLET | Freq: Every day | ORAL | Status: DC | PRN
  Administered 2024-05-26: 5 mg via ORAL
  Filled 2024-05-26: qty 1

## 2024-05-26 MED ORDER — PHENYLEPHRINE 80 MCG/ML (10ML) SYRINGE FOR IV PUSH (FOR BLOOD PRESSURE SUPPORT)
PREFILLED_SYRINGE | INTRAVENOUS | Status: DC | PRN
  Administered 2024-05-26: 80 ug via INTRAVENOUS

## 2024-05-26 MED ORDER — ORAL CARE MOUTH RINSE: 15.0000 mL | Freq: Once | OROMUCOSAL | Status: AC

## 2024-05-26 MED ORDER — MORPHINE SULFATE (PF) 2 MG/ML IV SOLN: 2.0000 mg | INTRAVENOUS | Status: DC | PRN

## 2024-05-26 MED ORDER — HYDROCODONE-ACETAMINOPHEN 5-325 MG PO TABS: 1.0000 | ORAL_TABLET | ORAL | Status: DC | PRN

## 2024-05-26 MED ORDER — HYDROMORPHONE HCL 1 MG/ML IJ SOLN
0.2500 mg | INTRAMUSCULAR | Status: DC | PRN
  Administered 2024-05-26 (×3): 0.5 mg via INTRAVENOUS

## 2024-05-26 MED ORDER — ONDANSETRON HCL 4 MG PO TABS: 4.0000 mg | ORAL_TABLET | Freq: Four times a day (QID) | ORAL | Status: DC | PRN

## 2024-05-26 MED ORDER — DOCUSATE SODIUM 100 MG PO CAPS
100.0000 mg | ORAL_CAPSULE | Freq: Two times a day (BID) | ORAL | Status: DC
  Administered 2024-05-26 (×2): 100 mg via ORAL
  Filled 2024-05-26 (×2): qty 1

## 2024-05-26 MED ORDER — METHOCARBAMOL 500 MG PO TABS
500.0000 mg | ORAL_TABLET | Freq: Four times a day (QID) | ORAL | Status: DC | PRN
  Administered 2024-05-26 – 2024-05-27 (×3): 500 mg via ORAL
  Filled 2024-05-26 (×3): qty 1

## 2024-05-26 MED ORDER — ROCURONIUM BROMIDE 10 MG/ML (PF) SYRINGE
PREFILLED_SYRINGE | INTRAVENOUS | Status: DC | PRN
  Administered 2024-05-26: 70 mg via INTRAVENOUS
  Administered 2024-05-26: 5 mg via INTRAVENOUS
  Administered 2024-05-26: 10 mg via INTRAVENOUS
  Administered 2024-05-26: 5 mg via INTRAVENOUS
  Administered 2024-05-26: 20 mg via INTRAVENOUS
  Administered 2024-05-26: 10 mg via INTRAVENOUS

## 2024-05-26 MED ORDER — PHENYLEPHRINE HCL-NACL 20-0.9 MG/250ML-% IV SOLN
INTRAVENOUS | Status: DC | PRN
  Administered 2024-05-26: 30 ug/min via INTRAVENOUS

## 2024-05-26 MED ORDER — HYDROMORPHONE HCL 1 MG/ML IJ SOLN
INTRAMUSCULAR | Status: DC | PRN
  Administered 2024-05-26 (×2): .5 mg via INTRAVENOUS

## 2024-05-26 MED ORDER — ONDANSETRON HCL 4 MG/2ML IJ SOLN
INTRAMUSCULAR | Status: DC | PRN
  Administered 2024-05-26: 4 mg via INTRAVENOUS

## 2024-05-26 MED ORDER — METHOCARBAMOL 1000 MG/10ML IJ SOLN: 500.0000 mg | Freq: Four times a day (QID) | INTRAMUSCULAR | Status: DC | PRN

## 2024-05-26 MED ORDER — ALUM & MAG HYDROXIDE-SIMETH 200-200-20 MG/5ML PO SUSP: 30.0000 mL | Freq: Four times a day (QID) | ORAL | Status: DC | PRN

## 2024-05-26 MED ORDER — ONDANSETRON HCL 4 MG/2ML IJ SOLN: 4.0000 mg | Freq: Four times a day (QID) | INTRAMUSCULAR | Status: DC | PRN

## 2024-05-26 MED ORDER — KETAMINE HCL 10 MG/ML IJ SOLN
INTRAMUSCULAR | Status: DC | PRN
  Administered 2024-05-26 (×2): 10 mg via INTRAVENOUS
  Administered 2024-05-26: 30 mg via INTRAVENOUS

## 2024-05-26 MED ORDER — BUPIVACAINE-EPINEPHRINE 0.25% -1:200000 IJ SOLN
INTRAMUSCULAR | Status: DC | PRN
  Administered 2024-05-26: 7 mL

## 2024-05-26 MED ORDER — PANTOPRAZOLE SODIUM 40 MG PO TBEC
40.0000 mg | DELAYED_RELEASE_TABLET | Freq: Every day | ORAL | Status: DC
  Administered 2024-05-26: 40 mg via ORAL
  Filled 2024-05-26: qty 1

## 2024-05-26 MED ORDER — THROMBIN 20000 UNITS EX KIT: PACK | CUTANEOUS | Status: DC | PRN

## 2024-05-26 MED ORDER — PROPOFOL 10 MG/ML IV BOLUS
INTRAVENOUS | Status: DC | PRN
  Administered 2024-05-26: 140 mg via INTRAVENOUS
  Administered 2024-05-26: 30 mg via INTRAVENOUS

## 2024-05-26 MED ORDER — MIDAZOLAM HCL (PF) 2 MG/2ML IJ SOLN
INTRAMUSCULAR | Status: DC | PRN
  Administered 2024-05-26: 2 mg via INTRAVENOUS

## 2024-05-26 MED ORDER — OXYCODONE HCL 5 MG/5ML PO SOLN: 5.0000 mg | Freq: Once | ORAL | Status: DC | PRN

## 2024-05-26 MED ORDER — 0.9 % SODIUM CHLORIDE (POUR BTL) OPTIME
TOPICAL | Status: DC | PRN
  Administered 2024-05-26 (×3): 1000 mL

## 2024-05-26 NOTE — Op Note (Signed)
 PATIENT NAME: Aaron Frey   MEDICAL RECORD NO.:   [id number]   DATE OF BIRTH: 07-Jan-1961   DATE OF PROCEDURE: 05/26/2024                               OPERATIVE REPORT     PREOPERATIVE DIAGNOSES: 1. Bilateral lumbar radiculopathy 2. L3-4 and L4-5 spinal stenosis 3. L4-5 spondylolisthesis   POSTOPERATIVE DIAGNOSES: 1. Bilateral lumbar radiculopathy 2. L3-4 and L4-5 spinal stenosis 3. L4-5 spondylolisthesis   PROCEDURES: 1. L3-4 AND L4/5 decompression, including bilateral partial facetectomies and foraminotomies 2. Left-sided L4-5 transforaminal lumbar interbody fusion 3. Right-sided L4-5 posterolateral fusion 4. Insertion of interbody device x1 (Globus expandable intervertebral spacer) 5. Placement of segmental posterior instrumentation L4, L5 bilaterally  6. Use of local autograft 7. Use of morselized allograft - Vivigen 8. Intraoperative use of fluoroscopy   SURGEON:  Oneil Priestly, MD   ASSISTANT:  Ileana Clara, PA-C   ANESTHESIA:  General endotracheal anesthesia.   COMPLICATIONS:  None   DISPOSITION:  Stable   ESTIMATED BLOOD LOSS:  100cc   INDICATIONS FOR SURGERY:  Briefly,  Mr. Renteria is a pleasant 64 year old male who did present to me with severe and ongoing pain in the right and left legs. I did feel that the symptoms were secondary to the findings noted above.  The patient failed conservative care and did wish to proceed with the procedure  noted above.   OPERATIVE DETAILS:  On 05/26/2024, the patient was brought to surgery and general endotracheal anesthesia was administered.  The patient was placed prone on a well-padded flat Jackson bed with a spinal frame.  Antibiotics were given and a time-out procedure was performed. The back was prepped and draped in the usual fashion.  A midline incision was made overlying the L3-4, L4-5 intervertebral spaces.  The fascia was incised at the midline.  The paraspinal musculature was bluntly swept laterally.  At this  point, a lateral intraoperative fluoroscopic image was obtained in order to identify the appropriate operative levels.  I then turned my attention to the L3-4 interspace.  The L3 spinous process was removed and a bilateral L3-4 partial facetectomy and foraminotomies was performed.  The stenosis was noted to be quite severe. In doing so, I was able to entirely decompress the L3-4 intervertebral space, including the right and left lateral recess, and right and left foramen.   At this point, I turned my attention to the L4-5 level.  In anticipation for the fusion, a high-speed bur was used to decorticate the facet joint on the right at L4-5, in the right posterolateral gutter.  I then turned my attention toward the instrumentation portion of the procedure. Anatomic landmarks for the pedicles were exposed. Using fluoroscopy, I did cannulate the L4 and L5 pedicles bilaterally, using a medial to lateral cortical trajectory technique.  At this point, 6 x 30 mm screws were placed into the right pedicles, and a 40 mm rod was placed into the tulip heads of the screw, and caps were also placed.  Distraction was then applied across the L4-5 intervertebral space, and the caps were then provisionally tightened.  On the left side, bone wax was placed into the cannulated pedicle holes.  I then proceeded with the decompressive aspect of the procedure at the L4-5 level.  A partial facetectomy was performed bilaterally at L4-5, decompressing the L4-5 intervertebral space, including the right and left lateral recess and bilateral neuroforamina.  I was very pleased with the decompression. With an assistant holding medial retraction of the traversing left L5 nerve, I did perform an annulotomy at the posterolateral aspect of the L4-5 intervertebral space.  I then used a series of curettes and pituitary rongeurs to perform a thorough and complete intervertebral diskectomy.  The intervertebral space was then liberally packed with  autograft as well as allograft in the form of Vivigen, as was the appropriate-sized intervertebral spacer.  The spacer was then tamped into position in the usual fashion, and expanded to 11.1 mm in height. I was very pleased with the press-fit of the spacer.  I then placed 6 mm screws on the left at L4 and L5. A 40-mm rod was then placed and caps were placed. The distraction was then released on the contralateral side.  All caps were then locked.  The wound was copiously irrigated with a total of approximately 3 L prior to placing the bone graft.  Additional autograft and allograft was then packed into the posterolateral gutter on the right side to help aid in the success of the fusion.  The wound was  explored for any undue bleeding and there was no substantial bleeding encountered.  Gel-Foam was placed over the laminectomy site.  The wound was then closed in layers using #1 Vicryl followed by 2-0 Vicryl, followed by 4-0 Monocryl.  Benzoin and Steri-Strips were applied followed by sterile dressing.     Of note, Ileana Clara was my assistant throughout surgery, and did aid in retraction, suctioning, the decompression, placement of the hardware, and closure.     Oneil Priestly, MD

## 2024-05-26 NOTE — Anesthesia Procedure Notes (Addendum)
 Procedure Name: Intubation Date/Time: 05/26/2024 8:42 AM  Performed by: Theophilus Burnet, Aloysius Pour, CRNAPre-anesthesia Checklist: Patient identified, Emergency Drugs available, Suction available and Patient being monitored Patient Re-evaluated:Patient Re-evaluated prior to induction Oxygen Delivery Method: Circle system utilized Preoxygenation: Pre-oxygenation with 100% oxygen Induction Type: IV induction Ventilation: Mask ventilation without difficulty Laryngoscope Size: Glidescope and 3 Grade View: Grade I Tube type: Oral Tube size: 7.5 mm Number of attempts: 1 Airway Equipment and Method: Stylet and Oral airway Placement Confirmation: ETT inserted through vocal cords under direct vision, positive ETCO2 and breath sounds checked- equal and bilateral Secured at: 23 cm Tube secured with: Tape Dental Injury: Teeth and Oropharynx as per pre-operative assessment

## 2024-05-26 NOTE — Progress Notes (Signed)
 PT Cancellation Note  Patient Details Name: Aaron Frey MRN: [id number] DOB: 1960-08-28   Cancelled Treatment:    Reason Eval/Treat Not Completed: Fatigue/lethargy limiting ability to participate (PT consult appreciated and chart reviewed. Pt greeted asleep in bed. He did not respond to verbal command or tactile stimulation. Will follow-up for PT evaluation tomorrow once sedation is out of his system from surgery.)  Randall SAUNDERS, PT, DPT Acute Rehabilitation Services Office: [phone] Secure Chat Preferred  Delon CHRISTELLA Callander 05/26/2024, 4:31 PM

## 2024-05-26 NOTE — H&P (Signed)
 "    PREOPERATIVE H&P  Chief Complaint: Bilateral leg pain  HPI: Aaron Frey is a 64 y.o. male who presents with ongoing pain in the bilateral legs  MRI reveals severe stenosis at L3/4 and L4/5 with instability at L4/5   Patient has failed multiple forms of conservative care and continues to have pain (see office notes for additional details regarding the patient's full course of treatment)  Past Medical History:  Diagnosis Date   Arthritis    C2 cervical fracture (HCC) 12/29/2013   C5 vertebral fracture (HCC) 12/29/2013   Class 1 obesity due to excess calories without serious comorbidity with body mass index (BMI) of 32.0 to 32.9 in adult 08/30/2016   Concussion 12/29/2013   Former tobacco use    Hypertension    Hypertriglyceridemia without hypercholesterolemia 09/03/2016   Lumbar transverse process fracture (HCC) 12/29/2013   Migraine    Prostate cancer (HCC)    Sacrum and coccyx fracture (HCC) 12/29/2013   Past Surgical History:  Procedure Laterality Date   ACHILLES TENDON REPAIR     BIOPSY PROSTATE     lump removed for chest Right    when he was a child   PENILE PROSTHESIS IMPLANT     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   Procedure: RIGHT TOTAL HIP ARTHROPLASTY;  Surgeon: Vernetta Lonni GRADE, MD;  Location: [facility] OR;  Service: Orthopedics;  Laterality: Right;  RNFA   Social History   Socioeconomic History   Marital status: Single    Spouse name: Not on file   Number of children: 4   Years of education: Not on file   Highest education level: Not on file  Occupational History   Not on file  Tobacco Use   Smoking status: Former    Current packs/day: 0.00    Average packs/day: 0.1 packs/day    Types: Cigarettes    Quit date: 2015    Years since quitting: 11.1   Smokeless tobacco: Never   Tobacco comments:    3-4 cigarettes/day  Vaping Use   Vaping status: Never Used  Substance and Sexual Activity   Alcohol use: Yes    Comment: Occasionally-  monthly   Drug use: No    Types: Marijuana   Sexual activity: Not on file  Other Topics Concern   Not on file  Social History Narrative   Lives alone   Right handed   Caffeine: 1 cup on 5 days of the week   Social Drivers of Health   Tobacco Use: Medium Risk (05/26/2024)   Patient History    Smoking Tobacco Use: Former    Smokeless Tobacco Use: Never    Passive Exposure: Not on Actuary Strain: Low Risk (02/09/2024)   Overall Financial Resource Strain (CARDIA)    Difficulty of Paying Living Expenses: Not hard at all  Food Insecurity: Low Risk (02/18/2024)   Received from [facility]   Epic    Within the past 12 months, you worried that your food would run out before you got money to buy more: Never true    Within the past 12 months, the food you bought just didn't last and you didn't have money to get more. : Never true  Transportation Needs: No Transportation Needs (02/18/2024)   Received from [facility]    In the past 12 months, has lack of reliable transportation kept you from medical appointments, meetings, work or from getting things needed for daily living? : No  Physical  Activity: Insufficiently Active (02/09/2024)   Exercise Vital Sign    Days of Exercise per Week: 2 days    Minutes of Exercise per Session: 20 min  Stress: Stress Concern Present (02/09/2024)   [employer] of Occupational Health - Occupational Stress Questionnaire    Feeling of Stress: To some extent  Social Connections: Socially Isolated (02/09/2024)   Social Connection and Isolation Panel    Frequency of Communication with Friends and Family: Once a week    Frequency of Social Gatherings with Friends and Family: Once a week    Attends Religious Services: Patient declined    Active Member of Clubs or Organizations: No    Attends Banker Meetings: Not on file    Marital Status: Never married  Depression (PHQ2-9): Low Risk (02/11/2024)    Depression (PHQ2-9)    PHQ-2 Score: 0  Alcohol Screen: Low Risk (02/09/2024)   Alcohol Screen    Last Alcohol Screening Score (AUDIT): 1  Housing: Low Risk (02/18/2024)   Received from [facility]   Epic    What is your living situation today?: I have a steady place to live    Think about the place you live. Do you have problems with any of the following? Choose all that apply:: None/None on this list  Utilities: Low Risk (02/18/2024)   Received from [facility]   Utilities    In the past 12 months has the electric, gas, oil, or water company threatened to shut off services in your home? : No  Health Literacy: Not on file   Family History  Problem Relation Age of Onset   Cancer Mother    Stroke Mother    Dementia Mother    Migraines Mother    ALS Father    Migraines Daughter    Migraines Son    Allergies[1] Prior to Admission medications  Medication Sig Start Date End Date Taking? Authorizing Provider  amLODipine  (NORVASC ) 10 MG tablet Take 1 tablet (10 mg total) by mouth daily. Needs appointment. 04/19/24  Yes Alvia Bring, DO  Atogepant  (QULIPTA ) 60 MG TABS Take 1 tablet (60 mg total) by mouth daily. 01/09/24  Yes Millikan, Megan, NP  gabapentin  (NEURONTIN ) 300 MG capsule Take 1 capsule (300 mg total) by mouth daily. Patient taking differently: Take 300 mg by mouth daily as needed (pain). 03/08/24  Yes Alvia Bring, DO  methocarbamol  (ROBAXIN ) 500 MG tablet TAKE 1 TABLET(500 MG) BY MOUTH EVERY 6 HOURS AS NEEDED FOR MUSCLE SPASMS 03/29/24  Yes Alvia Bring, DO  Rimegepant Sulfate (NURTEC) 75 MG TBDP Take at the onset of migraine. Only 1 tablet in 24 hours. 10/09/23   Millikan, Megan, NP  sildenafil  (VIAGRA ) 100 MG tablet TAKE 1/2 TO 1 TABLET(50 TO 100 MG) BY MOUTH DAILY AS NEEDED FOR ERECTILE DYSFUNCTION 03/02/24   Alvia Bring, DO     All other systems have been reviewed and were otherwise negative with the exception of those mentioned in the HPI and as  above.  Physical Exam: Vitals:   05/26/24 0654  BP: 137/88  Pulse: 66  Resp: 18  Temp: 98.2 F (36.8 C)  SpO2: 97%    Body mass index is 30.52 kg/m.  General: Alert, no acute distress Cardiovascular: No pedal edema Respiratory: No cyanosis, no use of accessory musculature Skin: No lesions in the area of chief complaint Neurologic: Sensation intact distally Psychiatric: Patient is competent for consent with normal mood and affect Lymphatic: No axillary or cervical lymphadenopathy   Assessment/Plan:  LUMBAR RADICULOPATHY Plan for Procedures: TRANSFORAMINAL LUMBAR INTERBODY FUSION (TLIF) WITH PEDICLE SCREW FIXATION 1 LEVEL, with a decompression at L3/4 and L4/5   Oneil LITTIE Priestly, MD 05/26/2024 7:09 AM    [1]  Allergies Allergen Reactions   Atacand  [Candesartan ]     Tingling, not able to  tolerate.   "

## 2024-05-26 NOTE — Plan of Care (Signed)

## 2024-05-26 NOTE — Transfer of Care (Signed)
 Immediate Anesthesia Transfer of Care Note  Patient: Aaron Frey  Procedure(s) Performed: LUMBAR THREE- LUMBAR FOUR, LUMBAR FOUR- LUMBAR FIVE DECOMPRESSION WITH LEFT-SIDED LUMBAR FOUR- LUMBAR FIVE TRANSFORAMINAL LUMBAR INTERBODY FUSION WITH INSTRUMENTATION AND ALLOGRAFT (Left: Spine Lumbar)  Patient Location: PACU  Anesthesia Type:General  Level of Consciousness: awake  Airway & Oxygen Therapy: Patient Spontanous Breathing  Post-op Assessment: Report given to RN  Post vital signs: stable  Last Vitals:  Vitals Value Taken Time  BP 130/76 05/26/24 12:30  Temp 36.7 C 05/26/24 12:30  Pulse 77 05/26/24 12:34  Resp 0 05/26/24 12:34  SpO2 92 % 05/26/24 12:34  Vitals shown include unfiled device data.  Last Pain:  Vitals:   05/26/24 1230  TempSrc:   PainSc: 0-No pain      Patients Stated Pain Goal: 1 (05/26/24 9340)  Complications: There were no known notable events for this encounter.

## 2024-05-27 ENCOUNTER — Other Ambulatory Visit (HOSPITAL_COMMUNITY): Payer: Self-pay

## 2024-05-27 MED ORDER — METHOCARBAMOL 500 MG PO TABS
500.0000 mg | ORAL_TABLET | Freq: Four times a day (QID) | ORAL | 2 refills | Status: AC | PRN
  Filled 2024-05-27: qty 30, 8d supply, fill #0

## 2024-05-27 MED ORDER — OXYCODONE-ACETAMINOPHEN 5-325 MG PO TABS
1.0000 | ORAL_TABLET | ORAL | 0 refills | Status: AC | PRN
  Filled 2024-05-27: qty 30, 5d supply, fill #0

## 2024-05-27 NOTE — Evaluation (Signed)
 Occupational Therapy Evaluation Patient Details Name: Aaron Frey MRN: [id number] DOB: [birth year] Today's Date: 05/27/2024   History of Present Illness   Aaron Frey is a 64 y.o. M who presented to [facility]/26/24 for bilateral lumbar radiculopathy, L3-4/L4-5 spinal stenosis, and L4-5 spondylolisthesis. Pt s/p TLIF with 1 level pedicle screw fixation and decompression at L3-4/L4-5. PMHx: arthritis, HTN, migraine, prostate CA.     Clinical Impressions Pt seen for OT evaluation this AM, agreeable for visit. Eager to discharge. 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 Reports using a RW for long walks outside his apartment PTA. Has friends to assist PRN. He presents today with intact strength in BUE, some numbness in LLE, overall close supervision for all transfers/mobility without AD. Needs incr effort to stand from surfaces and intermittent cues to maintain precautions. Mod I for self-care with cues provided for compensatory techniques/body mechanics. Adjusted TLSO for improved fit. Education handout provided and all questions answered. Pt is functioning at a level that is adequate for d/c from an OT standpoint, no further acute OT needs.     If plan is discharge home, recommend the following:   Assistance with cooking/housework;Assist for transportation     Functional Status Assessment   Patient has had a recent decline in their functional status and demonstrates the ability to make significant improvements in function in a reasonable and predictable amount of time.     Equipment Recommendations   None recommended by OT     Recommendations for Aaron Services   PT consult     Precautions/Restrictions   Precautions Precautions: Back Precaution Booklet Issued: Yes (comment) Recall of Precautions/Restrictions: Impaired Precaution/Restrictions Comments: intermittent cues throughout to maintain precautions Required Braces or  Orthoses: Spinal Brace Spinal Brace: Thoracolumbosacral orthotic;Applied in sitting position ([place] TLSO) Restrictions Weight Bearing Restrictions Per Provider Order: No     Mobility Bed Mobility Overal bed mobility: Modified Independent             General bed mobility comments: demo'd good log roll technique, HOB flat, no use of bed rails    Transfers Overall transfer level: Needs assistance   Transfers: Sit to/from Stand Sit to Stand: Supervision           General transfer comment: incr time/effort to come to upright, cued to squat when sitting to maintain proper body mechanics      Balance Overall balance assessment: Mild deficits observed, not formally tested                                         ADL either performed or assessed with clinical judgement   ADL Overall ADL's : Needs assistance/impaired Eating/Feeding: Independent   Grooming: Supervision/safety;Standing           Upper Body Dressing : Minimal assistance Upper Body Dressing Details (indicate cue type and reason): to adjust fit of TLSO brace, needs A sequence proper donning technique Lower Body Dressing: Set up;Sitting/lateral leans;Sit to/from stand;Cueing for compensatory techniques;Cueing for back precautions Lower Body Dressing Details (indicate cue type and reason): donned underwear and pants via modified figure four technique, no LOB during pants hike     Toileting- Clothing Manipulation and Hygiene: Modified independent Toileting - Clothing Manipulation Details (indicate cue type and reason): sat to urinate     Functional mobility during ADLs: Supervision/safety  Vision   Vision Assessment?: No apparent visual deficits     Perception         Praxis         Pertinent Vitals/Pain Pain Assessment Pain Assessment: 0-10 Pain Score: 2  Pain Location: back incision site Pain Descriptors / Indicators: Discomfort, Operative site guarding Pain  Intervention(s): Limited activity within patient's tolerance, Monitored during session     Extremity/Trunk Assessment Upper Extremity Assessment Upper Extremity Assessment: Overall WFL for tasks assessed   Lower Extremity Assessment Lower Extremity Assessment: Defer to PT evaluation   Cervical / Trunk Assessment Cervical / Trunk Assessment: Back Surgery   Communication Communication Communication: No apparent difficulties   Cognition Arousal: Alert Behavior During Therapy: WFL for tasks assessed/performed Cognition: No apparent impairments             OT - Cognition Comments: needs intermittent cues to maintain precautions throughout session                 Following commands: Intact       Cueing  General Comments   Cueing Techniques: Verbal cues;Gestural cues  incision site C/D/I   Exercises     Shoulder Instructions      Home Living Family/patient expects to be discharged to:: Private residence Living Arrangements: Alone Available Help at Discharge: Friend(s);Available PRN/intermittently Type of Home: Apartment Home Access: Level entry     Home Layout: One level     Bathroom Shower/Tub: Chief Strategy Officer: Handicapped height     Home Equipment: Agricultural Consultant (2 wheels);Cane - single point;Grab bars - tub/shower;Grab bars - toilet          Prior Functioning/Environment Prior Level of Function : Independent/Modified Independent;Driving;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; 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)             Mobility Comments: pt reports using RW for long walks PTA ADLs Comments: indep, + driving    OT Problem List: Decreased strength;Impaired balance (sitting and/or standing);Pain;Decreased range of motion   OT Treatment/Interventions:        OT Goals(Current goals can be found in the care plan section)   Acute Rehab OT Goals Patient Stated Goal: go home   OT Frequency:       Co-evaluation               AM-PAC OT 6 Clicks Daily Activity     Outcome Measure Help from another person eating meals?: None Help from another person taking care of personal grooming?: None Help from another person toileting, which includes using toliet, bedpan, or urinal?: None Help from another person bathing (including washing, rinsing, drying)?: A Little Help from another person to put on and taking off regular upper body clothing?: A Little Help from another person to put on and taking off regular lower body clothing?: None 6 Click Score: 22   End of Session Equipment Utilized During Treatment: Gait belt;Back brace Nurse Communication: Mobility status;Aaron (comment) (pt wanting breakfast ordered)  Activity Tolerance: Patient tolerated treatment well Patient left: with call bell/phone within reach (seated EOB)  OT Visit Diagnosis: Aaron abnormalities of gait and mobility (R26.89);Pain Pain - part of body:  (back)                Time: 9247-9172 OT Time Calculation (min): 35 min Charges:  OT General Charges $OT Visit: 1 Visit OT Evaluation $OT Eval Low Complexity: 1 Low OT Treatments $Self Care/Home Management : 23-37 mins  Aaron Frey M. Jamilia Jacques, OTR/L [facility]  Acute Rehabilitation Services [phone] Secure Chat Preferred  Mani Celestin 05/27/2024, 9:12 AM

## 2024-05-27 NOTE — Progress Notes (Signed)
 Patient alert and oriented, voided, ambulated. Surgical site and dressing is clean and dry no sign of infection. D/c instructions explain and given to the patient. All questions answered to the patient's satisfaction.

## 2024-05-27 NOTE — Evaluation (Signed)
 Physical Therapy Evaluation and Discharge  Patient Details Name: Aaron Frey MRN: [id number] DOB: 03-17-1961 Today's Date: 05/27/2024  History of Present Illness  Aaron Frey is a 64 y.o. 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 Pt s/p TLIF with 1 level pedicle screw fixation and decompression at L3-4/L4-5. PMHx: arthritis, HTN, migraine, prostate CA.  Clinical Impression  Pt admitted with above diagnosis. At the time of PT eval, pt was able to demonstrate transfers and ambulation with gross modified independence and no AD. Pt was educated on precautions, brace application/wearing schedule, appropriate activity progression, and car transfer. Pt currently with functional limitations due to the deficits listed below (see PT Problem List). Pt will benefit from skilled PT to increase their independence and safety with mobility to allow discharge to the venue listed below.          If plan is discharge home, recommend the following: A little help with bathing/dressing/bathroom;A little help with walking and/or transfers;Assistance with cooking/housework;Assist for transportation   Can travel by private vehicle        Equipment Recommendations None recommended by PT  Recommendations for Other Services       Functional Status Assessment Patient has had a recent decline in their functional status and demonstrates the ability to make significant improvements in function in a reasonable and predictable amount of time.     Precautions / Restrictions Precautions Precautions: Back Precaution Booklet Issued: Yes (comment) Recall of Precautions/Restrictions: Impaired Precaution/Restrictions Comments: intermittent cues throughout to maintain precautions Required Braces or Orthoses: Spinal Brace Spinal Brace: Thoracolumbosacral orthotic;Applied in sitting position Restrictions Weight Bearing Restrictions Per  Provider Order: No      Mobility  Bed Mobility Overal bed mobility: Modified Independent             General bed mobility comments: HOB flat and rails lowered to simulate home environment. No assist required.    Transfers Overall transfer level: Modified independent Equipment used: None               General transfer comment: Several rounds of sit<>stand practice. Pt was able to perform transfers without assistance. Cues for wide BOS and for push up from thighs to stand, and reaching back for bed during sit. Mod I by end of session.    Ambulation/Gait Ambulation/Gait assistance: Modified independent (Device/Increase time) Gait Distance (Feet): 350 Feet Assistive device: None Gait Pattern/deviations: Step-through pattern, Decreased stride length, Trunk flexed Gait velocity: Decreased Gait velocity interpretation: 1.31 - 2.62 ft/sec, indicative of limited community ambulator   General Gait Details: VC's for improved posture, forward gaze, and improved heel strike. Pt initially scuffing feet along however able to make corrective changes with cues.  Stairs            Wheelchair Mobility     Tilt Bed    Modified Rankin (Stroke Patients Only)       Balance Overall balance assessment: Mild deficits observed, not formally tested                                           Pertinent Vitals/Pain Pain Assessment Pain Assessment: Faces Faces Pain Scale: Hurts a little bit Pain Location: back incision site Pain Descriptors / Indicators: Discomfort, Operative site guarding Pain Intervention(s): Limited activity within patient's tolerance, Monitored during session, Repositioned    Home Living Family/patient expects  to be discharged to:: Private residence Living Arrangements: Alone Available Help at Discharge: Friend(s);Available PRN/intermittently Type of Home: Apartment Home Access: Level entry       Home Layout: One level Home  Equipment: Agricultural Consultant (2 wheels);Cane - single point;Grab bars - tub/shower;Grab bars - toilet      Prior Function Prior Level of Function : Independent/Modified Independent;Driving;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; 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)             Mobility Comments: pt reports using RW for long walks PTA ADLs Comments: indep, + driving     Extremity/Trunk Assessment   Upper Extremity Assessment Upper Extremity Assessment: Overall WFL for tasks assessed    Lower Extremity Assessment Lower Extremity Assessment: Generalized weakness (Mild; consistent with pre-op diagnosis)    Cervical / Trunk Assessment Cervical / Trunk Assessment: Back Surgery  Communication   Communication Communication: No apparent difficulties    Cognition Arousal: Alert Behavior During Therapy: WFL for tasks assessed/performed   PT - Cognitive impairments: No apparent impairments                         Following commands: Intact       Cueing Cueing Techniques: Verbal cues, Gestural cues     General Comments General comments (skin integrity, edema, etc.): incision site C/D/I    Exercises     Assessment/Plan    PT Assessment Patient does not need any further PT services  PT Problem List         PT Treatment Interventions      PT Goals (Current goals can be found in the Care Plan section)  Acute Rehab PT Goals Patient Stated Goal: Home today PT Goal Formulation: All assessment and education complete, DC therapy    Frequency       Co-evaluation               AM-PAC PT 6 Clicks Mobility  Outcome Measure Help needed turning from your back to your side while in a flat bed without using bedrails?: None Help needed moving from lying on your back to sitting on the side of a flat bed without using bedrails?: None Help needed moving to and from a bed to a chair (including a wheelchair)?: None Help needed standing up from a chair using your arms  (e.g., wheelchair or bedside chair)?: None Help needed to walk in hospital room?: None Help needed climbing 3-5 steps with a railing? : A Little 6 Click Score: 23    End of Session Equipment Utilized During Treatment: Gait belt;Back brace Activity Tolerance: Patient tolerated treatment well Patient left: in bed;with call bell/phone within reach Nurse Communication: Mobility status PT Visit Diagnosis: Unsteadiness on feet (R26.81);Pain Pain - part of body:  (back)    Time: 9093-9076 PT Time Calculation (min) (ACUTE ONLY): 17 min   Charges:   PT Evaluation $PT Eval Low Complexity: 1 Low   PT General Charges $$ ACUTE PT VISIT: 1 Visit         Leita Sable, PT, DPT Acute Rehabilitation Services Secure Chat Preferred Office: [phone]   Leita JONETTA Sable 05/27/2024, 10:04 AM

## 2024-05-27 NOTE — Progress Notes (Signed)
" ° ° °  Patient doing well   Patient has been ambulating   Physical Exam: Vitals:   05/26/24 2258 05/27/24 0351  BP: 138/76 113/67  Pulse: 82 74  Resp: 20 20  Temp: 98.2 F (36.8 C) 98.6 F (37 C)  SpO2: 96% 96%    Dressing in place NVI  POD #1 s/p decompression and fusion, doing well  - up with PT/OT, encourage ambulation - Percocet for pain, Robaxin  for muscle spasms - likely d/c home today with f/u in 2 weeks "

## 2024-05-27 NOTE — Anesthesia Postprocedure Evaluation (Signed)
"   Anesthesia Post Note  Patient: Jerilynn Minor  Procedure(s) Performed: LUMBAR THREE- LUMBAR FOUR, LUMBAR FOUR- LUMBAR FIVE DECOMPRESSION WITH LEFT-SIDED LUMBAR FOUR- LUMBAR FIVE TRANSFORAMINAL LUMBAR INTERBODY FUSION WITH INSTRUMENTATION AND ALLOGRAFT (Left: Spine Lumbar)     Patient location during evaluation: PACU Anesthesia Type: General Level of consciousness: awake and alert Pain management: pain level controlled Vital Signs Assessment: post-procedure vital signs reviewed and stable Respiratory status: spontaneous breathing, nonlabored ventilation, respiratory function stable and patient connected to nasal cannula oxygen Cardiovascular status: blood pressure returned to baseline and stable Postop Assessment: no apparent nausea or vomiting Anesthetic complications: no   There were no known notable events for this encounter.  Last Vitals:  Vitals:   05/27/24 0351 05/27/24 0841  BP: 113/67 138/73  Pulse: 74 75  Resp: 20 18  Temp: 37 C 36.7 C  SpO2: 96% 98%    Last Pain:  Vitals:   05/27/24 0841  TempSrc: Oral  PainSc:                  Tisha Cline L Maryn Freelove      "

## 2024-08-11 ENCOUNTER — Ambulatory Visit: Admitting: Family Medicine

## 2025-01-03 ENCOUNTER — Ambulatory Visit: Admitting: Adult Health
